# Patient Record
Sex: Male | Born: 1935 | Race: White | Hispanic: No | Marital: Married | Smoking: Former smoker
Health system: Southern US, Community
[De-identification: ages and names within clinical notes are randomized; demographics above are authoritative.]

## PROBLEM LIST (undated history)

## (undated) DIAGNOSIS — I739 Peripheral vascular disease, unspecified: Secondary | ICD-10-CM

## (undated) DIAGNOSIS — I10 Essential (primary) hypertension: Secondary | ICD-10-CM

## (undated) DIAGNOSIS — I2510 Atherosclerotic heart disease of native coronary artery without angina pectoris: Secondary | ICD-10-CM

## (undated) DIAGNOSIS — E119 Type 2 diabetes mellitus without complications: Secondary | ICD-10-CM

## (undated) DIAGNOSIS — C801 Malignant (primary) neoplasm, unspecified: Secondary | ICD-10-CM

## (undated) DIAGNOSIS — N189 Chronic kidney disease, unspecified: Secondary | ICD-10-CM

## (undated) HISTORY — PX: NEPHRECTOMY: SHX65

## (undated) HISTORY — PX: HERNIA REPAIR: SHX51

## (undated) HISTORY — PX: EYE SURGERY: SHX253

## (undated) HISTORY — PX: CORONARY ARTERY BYPASS GRAFT: SHX141

---

## 2004-06-13 ENCOUNTER — Ambulatory Visit: Payer: Self-pay | Admitting: Radiation Oncology

## 2004-06-22 ENCOUNTER — Ambulatory Visit: Payer: Self-pay | Admitting: Radiation Oncology

## 2005-06-12 ENCOUNTER — Ambulatory Visit: Payer: Self-pay | Admitting: Radiation Oncology

## 2005-06-22 ENCOUNTER — Ambulatory Visit: Payer: Self-pay | Admitting: Radiation Oncology

## 2006-06-14 ENCOUNTER — Ambulatory Visit: Payer: Self-pay | Admitting: Radiation Oncology

## 2006-06-22 ENCOUNTER — Ambulatory Visit: Payer: Self-pay | Admitting: Radiation Oncology

## 2007-01-08 ENCOUNTER — Ambulatory Visit: Payer: Self-pay | Admitting: Urology

## 2007-02-08 ENCOUNTER — Ambulatory Visit: Payer: Self-pay

## 2007-02-09 ENCOUNTER — Inpatient Hospital Stay: Payer: Self-pay | Admitting: Surgery

## 2007-02-09 ENCOUNTER — Other Ambulatory Visit: Payer: Self-pay

## 2007-05-23 ENCOUNTER — Ambulatory Visit: Payer: Self-pay | Admitting: Radiation Oncology

## 2007-06-13 ENCOUNTER — Ambulatory Visit: Payer: Self-pay | Admitting: Radiation Oncology

## 2007-06-23 ENCOUNTER — Ambulatory Visit: Payer: Self-pay | Admitting: Radiation Oncology

## 2008-05-22 ENCOUNTER — Ambulatory Visit: Payer: Self-pay | Admitting: Radiation Oncology

## 2008-06-15 ENCOUNTER — Ambulatory Visit: Payer: Self-pay | Admitting: Radiation Oncology

## 2008-06-22 ENCOUNTER — Ambulatory Visit: Payer: Self-pay | Admitting: Radiation Oncology

## 2009-05-22 ENCOUNTER — Ambulatory Visit: Payer: Self-pay | Admitting: Radiation Oncology

## 2009-06-16 ENCOUNTER — Ambulatory Visit: Payer: Self-pay | Admitting: Radiation Oncology

## 2009-06-22 ENCOUNTER — Ambulatory Visit: Payer: Self-pay | Admitting: Radiation Oncology

## 2009-07-28 ENCOUNTER — Ambulatory Visit: Payer: Self-pay | Admitting: Internal Medicine

## 2010-06-01 ENCOUNTER — Emergency Department: Payer: Self-pay | Admitting: Emergency Medicine

## 2010-06-16 ENCOUNTER — Ambulatory Visit: Payer: Self-pay | Admitting: Radiation Oncology

## 2010-06-22 ENCOUNTER — Ambulatory Visit: Payer: Self-pay | Admitting: Radiation Oncology

## 2012-01-31 ENCOUNTER — Ambulatory Visit: Payer: Self-pay | Admitting: Urology

## 2012-01-31 DIAGNOSIS — I2510 Atherosclerotic heart disease of native coronary artery without angina pectoris: Secondary | ICD-10-CM

## 2012-01-31 LAB — CBC WITH DIFFERENTIAL/PLATELET
Basophil %: 0.5 %
Eosinophil #: 0.1 10*3/uL (ref 0.0–0.7)
Eosinophil %: 2.2 %
HGB: 13.8 g/dL (ref 13.0–18.0)
Lymphocyte #: 1.8 10*3/uL (ref 1.0–3.6)
Lymphocyte %: 28.8 %
MCHC: 34.6 g/dL (ref 32.0–36.0)
MCV: 91 fL (ref 80–100)
Monocyte %: 14.8 %
Neutrophil #: 3.4 10*3/uL (ref 1.4–6.5)
Neutrophil %: 53.7 %
RBC: 4.37 10*6/uL — ABNORMAL LOW (ref 4.40–5.90)
WBC: 6.4 10*3/uL (ref 3.8–10.6)

## 2012-01-31 LAB — BASIC METABOLIC PANEL
BUN: 22 mg/dL — ABNORMAL HIGH (ref 7–18)
Chloride: 109 mmol/L — ABNORMAL HIGH (ref 98–107)
Co2: 26 mmol/L (ref 21–32)
Creatinine: 1.51 mg/dL — ABNORMAL HIGH (ref 0.60–1.30)
Sodium: 143 mmol/L (ref 136–145)

## 2012-02-05 ENCOUNTER — Ambulatory Visit: Payer: Self-pay | Admitting: Urology

## 2012-12-25 ENCOUNTER — Ambulatory Visit: Payer: Self-pay | Admitting: Gastroenterology

## 2012-12-27 LAB — PATHOLOGY REPORT

## 2013-01-14 ENCOUNTER — Ambulatory Visit: Payer: Self-pay | Admitting: Urology

## 2013-01-14 DIAGNOSIS — I2510 Atherosclerotic heart disease of native coronary artery without angina pectoris: Secondary | ICD-10-CM

## 2013-01-14 LAB — BASIC METABOLIC PANEL
Anion Gap: 3 — ABNORMAL LOW (ref 7–16)
Calcium, Total: 9 mg/dL (ref 8.5–10.1)
Chloride: 106 mmol/L (ref 98–107)
Creatinine: 1.82 mg/dL — ABNORMAL HIGH (ref 0.60–1.30)
EGFR (Non-African Amer.): 34 — ABNORMAL LOW
Osmolality: 280 (ref 275–301)

## 2013-01-14 LAB — CBC WITH DIFFERENTIAL/PLATELET
Basophil #: 0 10*3/uL (ref 0.0–0.1)
Basophil %: 0.4 %
Eosinophil %: 2.9 %
HCT: 36.4 % — ABNORMAL LOW (ref 40.0–52.0)
HGB: 12.8 g/dL — ABNORMAL LOW (ref 13.0–18.0)
Lymphocyte #: 2 10*3/uL (ref 1.0–3.6)
Lymphocyte %: 34.1 %
MCH: 31.5 pg (ref 26.0–34.0)
MCHC: 35.1 g/dL (ref 32.0–36.0)
MCV: 90 fL (ref 80–100)
Neutrophil %: 50 %
Platelet: 117 10*3/uL — ABNORMAL LOW (ref 150–440)
WBC: 5.9 10*3/uL (ref 3.8–10.6)

## 2013-01-17 LAB — URINE CULTURE

## 2013-01-22 ENCOUNTER — Ambulatory Visit: Payer: Self-pay | Admitting: Urology

## 2014-02-14 ENCOUNTER — Ambulatory Visit: Payer: Self-pay | Admitting: Unknown Physician Specialty

## 2014-04-14 ENCOUNTER — Ambulatory Visit: Payer: Self-pay | Admitting: Internal Medicine

## 2014-09-08 NOTE — Op Note (Signed)
PATIENT NAME:  Gregory Luna, Gregory Luna MR#:  [id number] DATE OF BIRTH:  Feb 04, 1929  DATE OF PROCEDURE:  02/05/2012  PREOPERATIVE DIAGNOSIS: Transitional cell carcinoma of the bladder.   POSTOPERATIVE DIAGNOSIS: Transitional cell carcinoma of the bladder, urethral stricture (severe), possible carcinoma in situ.  SURGEON: Alcus Dad, MD  ANESTHESIA: General.   INDICATIONS: This 81 year old gentleman has had recurrent transitional cell carcinoma of the bladder for several years. On his most recent cystoscopy, he was found to have multiple areas of inflammation of the bladder wall reminiscent of carcinoma in situ. There were a few fronds. He has a severe urethral stricture in the posterior urethra. He is scheduled for dilatation of the urethral stricture, biopsy of the bladder in suspicious areas and instillation of mitomycin.   DESCRIPTION OF PROCEDURE: In the dorsal lithotomy position under general anesthesia, the genital area was prepped and draped for endoscopic work. The #22 cystoscope was introduced to the posterior urethra where a dense stricture was localized. Gregory Luna sounds were used to progressively dilate the strictured area to 24 [place].   The #22 French cystoscope was introduced and using biopsy forceps a number of areas were biopsied on the posterior bladder wall. The ball electrode was introduced and the bleeding spots brought under control. Following that an 18-French Foley catheter was introduced and 40 mg of mitomycin instilled into the bladder. The patient tolerated the procedures well and returned to the recovery area in satisfactory condition. ____________________________ Alcus Dad, MD jsh:slb D: 02/05/2012 15:48:43 ET     T: 02/05/2012 17:51:50 ET        JOB#: [id number] cc: Alcus Dad, MD, <Dictator> Alcus Dad MD ELECTRONICALLY SIGNED 02/06/2012 12:33

## 2014-09-11 NOTE — Op Note (Signed)
PATIENT NAME:  Gregory Luna, Gregory Luna MR#:  [id number] DATE OF BIRTH:  [birth year]  DATE OF PROCEDURE:  01/22/2013  PREOPERATIVE DIAGNOSES:  1.  Neoplasm bladder, unspecified.  2.  History of transitional cell carcinoma of the bladder.   POSTOPERATIVE DIAGNOSES:  1.  Neoplasm bladder, unspecified.  2.  History of transitional cell carcinoma of the bladder.   PROCEDURES:  1.  Transurethral resection of bladder tumor.  2.  Right retrograde pyelogram.   SURGEON: John Giovanni, M.D.   ASSISTANT: None.   ANESTHESIA: General.   INDICATION: This is an 81 year old male with a long history of recurrent transitional cell carcinoma of the bladder. He was initially diagnosed with TCC of the left renal pelvis and 1995 and underwent a left nephroureterectomy. He has had recurrent superficial transitional cell carcinoma of the bladder, multiple bladder biopsies and TURBT. Recent cystoscopy did show abnormal appearing mucosa at the bladder base. He presents for cystoscopy under anesthesia with TURBT and right retrograde pyelogram.   DESCRIPTION OF PROCEDURE: The patient was taken to the operating room where a general anesthetic was administered. He was then placed in the low lithotomy position and his external genitalia were prepped and draped in the usual fashion. Timeout was performed per protocol, with all in agreement. A 21-French cystoscope with 30 degree lens was lubricated and passed under direct vision. The urethra was normal in caliber without stricture. The prostate was remarkable for mild lateral lobe enlargement and fairly significant bladder neck elevation. Panendoscopy was performed. At the left and right bladder base, was an area of erythema with fibrinous exudative appearing mucosa. No definite papillary or solid lesions were seen. Left ureteral orifice was surgically absent. The right ureteral orifice was effluxing clear urine. An 8-French cone-tipped catheter was placed through the cystoscope at  the right ureteral orifice. Right retrograde pyelogram was performed. The ureter showed no filling defect or evidence of obstruction. The right renal pelvis showed no filling defect or evidence of obstruction. There was prompt drainage of contrast under real-time fluoroscopy. The cystoscope was removed.   A 27-French continuous flow resectoscope sheath with visual obturator was passed into the bladder without difficulty. The Iglesias resectoscope loop was then placed into the sheath. The left bladder base area was accomplished at approximately 2 cm and was resected down to superficial muscle. The smaller area on the right was approximately 1 cm and resected. Hemostasis was obtained with cautery. All specimen was removed with irrigation. At the conclusion of the procedure, the right ureter was effluxing clear urine. Hemostasis was adequate. The resectoscope was removed. An 18-French Foley catheter was placed with return of light rosea effluent upon irrigation. Catheter was placed to gravity drainage. B and O suppository was placed per rectum. He was taken to the PACU in stable condition. There were no complications. EBL was minimal.   ____________________________ Ronda Fairly. Bernardo Heater, MD scs:aw D: 01/22/2013 14:44:17 ET T: 01/22/2013 15:44:11 ET JOB#: [id number]  cc: Nicki Reaper C. Bernardo Heater, MD, <Dictator> Abbie Sons MD ELECTRONICALLY SIGNED 02/12/2013 10:03

## 2014-11-29 ENCOUNTER — Emergency Department
Admission: EM | Admit: 2014-11-29 | Discharge: 2014-11-29 | Disposition: A | Discharge status: Home / Self Care | Payer: Medicare Other | Attending: Student | Admitting: Student

## 2014-11-29 ENCOUNTER — Emergency Department: Payer: Medicare Other

## 2014-11-29 ENCOUNTER — Encounter: Payer: Self-pay | Admitting: Radiology

## 2014-11-29 DIAGNOSIS — W19XXXA Unspecified fall, initial encounter: Secondary | ICD-10-CM

## 2014-11-29 DIAGNOSIS — S0990XA Unspecified injury of head, initial encounter: Secondary | ICD-10-CM | POA: Diagnosis present

## 2014-11-29 DIAGNOSIS — S01112A Laceration without foreign body of left eyelid and periocular area, initial encounter: Secondary | ICD-10-CM | POA: Insufficient documentation

## 2014-11-29 DIAGNOSIS — Y9289 Other specified places as the place of occurrence of the external cause: Secondary | ICD-10-CM | POA: Diagnosis not present

## 2014-11-29 DIAGNOSIS — Y998 Other external cause status: Secondary | ICD-10-CM | POA: Diagnosis not present

## 2014-11-29 DIAGNOSIS — S63285A Dislocation of proximal interphalangeal joint of left ring finger, initial encounter: Secondary | ICD-10-CM | POA: Insufficient documentation

## 2014-11-29 DIAGNOSIS — W01198A Fall on same level from slipping, tripping and stumbling with subsequent striking against other object, initial encounter: Secondary | ICD-10-CM | POA: Diagnosis not present

## 2014-11-29 DIAGNOSIS — IMO0002 Reserved for concepts with insufficient information to code with codable children: Secondary | ICD-10-CM

## 2014-11-29 DIAGNOSIS — Y9389 Activity, other specified: Secondary | ICD-10-CM | POA: Insufficient documentation

## 2014-11-29 DIAGNOSIS — S63259A Unspecified dislocation of unspecified finger, initial encounter: Secondary | ICD-10-CM

## 2014-11-29 MED ORDER — BACITRACIN ZINC 500 UNIT/GM EX OINT
1.0000 "application " | TOPICAL_OINTMENT | Freq: Two times a day (BID) | CUTANEOUS | Status: DC
  Administered 2014-11-29: 1 via TOPICAL

## 2014-11-29 MED ORDER — BACITRACIN ZINC 500 UNIT/GM EX OINT
TOPICAL_OINTMENT | CUTANEOUS | Status: AC
  Administered 2014-11-29: 1 via TOPICAL
  Filled 2014-11-29: qty 0.9

## 2014-11-29 MED ORDER — LIDOCAINE HCL (PF) 1 % IJ SOLN
5.0000 mL | Freq: Once | INTRAMUSCULAR | Status: AC
  Administered 2014-11-29: 5 mL via INTRADERMAL

## 2014-11-29 MED ORDER — LIDOCAINE HCL (PF) 1 % IJ SOLN
INTRAMUSCULAR | Status: AC
  Filled 2014-11-29: qty 5

## 2014-11-29 MED ORDER — LIDOCAINE HCL (PF) 1 % IJ SOLN
INTRAMUSCULAR | Status: AC
  Administered 2014-11-29: 5 mL via INTRADERMAL
  Filled 2014-11-29: qty 5

## 2014-11-29 NOTE — ED Provider Notes (Signed)
[facility] Emergency Department Provider Note  ____________________________________________  Time seen: Approximately 5:39 PM  I have reviewed the triage vital signs and the nursing notes.   HISTORY  Chief Complaint Fall    HPI [name] is a 81 y.o. 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 The patient walked into the other room, to decided to sit down on his bed and his leg slid out from under him. He fell forward hitting his left eyebrow/forehead on a table in front of him. No loss of consciousness. He immediately cried out for help and his wife found him. He is also complaining of left fourth finger pain and has a notable deformity there. Current pain severity is moderate. He did take Opana and Dilaudid today which she is prescribed. No modifying factors. No vision change. No recent illness including no cough, sneezing, runny nose, congestion, vomiting or diarrhea. No fevers or chills.   History reviewed. No pertinent past medical history.  There are no active problems to display for this patient.   No past surgical history on file.  No current outpatient prescriptions on file.  Allergies Review of patient's allergies indicates no known allergies.  No family history on file.  Social History History  Substance Use Topics  . Smoking status: Not on file  . Smokeless tobacco: Not on file  . Alcohol Use: Not on file    Review of Systems Constitutional: No fever/chills Eyes: No visual changes. ENT: No sore throat. Cardiovascular: Denies chest pain. Respiratory: Denies shortness of breath. Gastrointestinal: No abdominal pain.  No nausea, no vomiting.  No diarrhea.  No constipation. Genitourinary: Negative for dysuria. Musculoskeletal: Negative for back pain. Skin: Negative for rash. Neurological: Negative for headaches, focal  weakness or numbness.  10-point ROS otherwise negative.  ____________________________________________   PHYSICAL EXAM:  VITAL SIGNS: ED Triage Vitals  Enc Vitals Group     BP 11/29/14 1725 140/67 mmHg     Pulse Rate 11/29/14 1725 77     Resp --      Temp 11/29/14 1725 97.8 F (36.6 C)     Temp Source 11/29/14 1725 Oral     SpO2 11/29/14 1725 100 %     Weight 11/29/14 1725 175 lb (79.379 kg)     Height 11/29/14 1725 6\' 1"  (1.854 m)     Head Cir --      Peak Flow --      Pain Score 11/29/14 1727 10     Pain Loc --      Pain Edu? --      Excl. in [place]? --     Constitutional: Alert and oriented self and place. Well appearing and in no acute distress. Eyes: Conjunctivae are normal. Head:  The patient has chronic redness/discoloration/swelling to the left eyebrow, left temporal region as well as a gaping 1 cm laceration with surrounding hematoma. He has mild left periorbital swelling, chronic drainage from the left eye but extraocular movements appear intact and pupils are equally reactive to light. Nose: No congestion/rhinnorhea. Mouth/Throat: Mucous membranes are moist.  Oropharynx non-erythematous. Neck: No stridor. No midline c-spine tenderness to palpation. Cardiovascular: Normal rate, regular rhythm. Grossly normal heart sounds.  Good peripheral circulation. Respiratory: Normal respiratory effort.  No retractions. Lungs CTAB. Gastrointestinal: Soft and nontender. No distention. No abdominal bruits. No CVA tenderness. Genitourinary: deferred Musculoskeletal: No lower extremity tenderness nor edema.  No joint effusions. Obvious dislocation/deformity of the left fourth finger. Neurologic:  Normal speech and language. No gross focal neurologic deficits are appreciated. Speech is normal. No gait instability. Skin:  Skin is warm, dry and intact. No rash noted. Psychiatric: Mood and affect are normal. Speech and behavior are normal.  ____________________________________________    LABS (all labs ordered are listed, but only abnormal results are displayed)  Labs Reviewed - No data to display ____________________________________________  EKG  none ____________________________________________  RADIOLOGY  CT head/c-spine/maxillofacial IMPRESSION: Mild diffuse cortical atrophy. Mild chronic ischemic white matter disease. No acute intracranial abnormality seen.  No acute abnormality seen in maxillofacial region.  Multilevel degenerative disc disease is noted. No acute abnormality seen in the cervical spine.   Xray left hand  IMPRESSION: Dislocation of the 4th digit at the PIP joint.   Xray left 4th finger IMPRESSION: 1. Anatomic alignment has been restored, status post closed reduction and splinting.  ____________________________________________   PROCEDURES  Procedure(s) performed:   Reduction of dislocation Date/Time: 9:10 PM Performed by: Joanne Gavel Authorized by: Loura Pardon A Consent: Verbal consent obtained. Risks and benefits: risks, benefits and alternatives were discussed Consent given by: patient Required items: required blood products, implants, devices, and special equipment available Time out: Immediately prior to procedure a "time out" was called to verify the correct patient, procedure, equipment, support staff and site/side marked as required.  Patient sedated:no  Vitals: Vital signs were monitored during sedation. Patient tolerance: Patient tolerated the procedure well with no immediate complications. Joint: left fouth PIP Reduction technique: gentle traction Digital nerve block performed with 8 m of 1% lidocaine without epi   Critical Care performed: No  ____________________________________________   INITIAL IMPRESSION / ASSESSMENT AND PLAN / ED COURSE  Pertinent labs & imaging results that were available during my care of the patient were reviewed by me and considered in my medical decision making (see  chart for details).  Gregory Luna is a 81 y.o. 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 On exam, he does not appear to be in any acute distress. Vital signs stable, he is afebrile. He does have obvious gaping laceration associated with the left lateral eyebrow/temporal region which will require repair. Tetanus is up-to-date. He has chronic discharge associated with the left eye, according to his family.Marland Kitchen Extraocular movements appear intact at this time. His only other injury appears to be his associated with the left fourth finger  we'll obtain CT head, maxilloacial CT, CT C-spine as well as plain films of the left hand.  ----------------------------------------- 8:00 PM on 11/29/2014 -----------------------------------------  Successful reduction of left PIP dislocation, splint applied by me, neurovascularly intact after the procedure which was tolerated well. I discussed laceration repair of this wound associated with the left eyebrow. The patient's daughter has photographed it, sent the picture to the patient's skin cancer doctor who recommends against any sutures given history of poor vascular supply overlying his known skin cancer (he recommends healing by secondary intention) and he will follow-up with the patient in clinic in 2 days. The family and the patient do not want the laceration sutured so will irrigate and dress it. Return precautions discussed. We'll DC home. ____________________________________________   FINAL CLINICAL IMPRESSION(S) / ED DIAGNOSES  Final diagnoses:  Fall, initial encounter  Head injury without concussion or intracranial hemorrhage, initial encounter  Laceration  Dislocation, finger closed, initial encounter  Joanne Gavel, MD 11/29/14 2110

## 2014-11-29 NOTE — ED Notes (Signed)
Fell - no loc, no dizziness. Has left ring finger dislocation and left brow injury with eye hematoma

## 2014-11-29 NOTE — ED Notes (Signed)
OK per MD for family to give patient his home pain medications.

## 2015-03-26 ENCOUNTER — Inpatient Hospital Stay
Admission: EM | Admit: 2015-03-26 | Discharge: 2015-03-28 | DRG: 872 | Disposition: A | Discharge status: Home Health | Payer: Medicare Other | Attending: Internal Medicine | Admitting: Internal Medicine

## 2015-03-26 ENCOUNTER — Encounter: Payer: Self-pay | Admitting: Emergency Medicine

## 2015-03-26 ENCOUNTER — Emergency Department: Payer: Medicare Other

## 2015-03-26 DIAGNOSIS — Z905 Acquired absence of kidney: Secondary | ICD-10-CM | POA: Diagnosis not present

## 2015-03-26 DIAGNOSIS — G893 Neoplasm related pain (acute) (chronic): Secondary | ICD-10-CM | POA: Diagnosis present

## 2015-03-26 DIAGNOSIS — N390 Urinary tract infection, site not specified: Secondary | ICD-10-CM | POA: Diagnosis not present

## 2015-03-26 DIAGNOSIS — Z951 Presence of aortocoronary bypass graft: Secondary | ICD-10-CM | POA: Diagnosis not present

## 2015-03-26 DIAGNOSIS — A419 Sepsis, unspecified organism: Secondary | ICD-10-CM | POA: Diagnosis present

## 2015-03-26 DIAGNOSIS — Z8546 Personal history of malignant neoplasm of prostate: Secondary | ICD-10-CM | POA: Diagnosis not present

## 2015-03-26 DIAGNOSIS — I129 Hypertensive chronic kidney disease with stage 1 through stage 4 chronic kidney disease, or unspecified chronic kidney disease: Secondary | ICD-10-CM | POA: Diagnosis present

## 2015-03-26 DIAGNOSIS — C679 Malignant neoplasm of bladder, unspecified: Secondary | ICD-10-CM | POA: Diagnosis not present

## 2015-03-26 DIAGNOSIS — R410 Disorientation, unspecified: Secondary | ICD-10-CM

## 2015-03-26 DIAGNOSIS — R509 Fever, unspecified: Secondary | ICD-10-CM

## 2015-03-26 DIAGNOSIS — N183 Chronic kidney disease, stage 3 (moderate): Secondary | ICD-10-CM | POA: Diagnosis not present

## 2015-03-26 DIAGNOSIS — N179 Acute kidney failure, unspecified: Secondary | ICD-10-CM | POA: Diagnosis present

## 2015-03-26 DIAGNOSIS — E860 Dehydration: Secondary | ICD-10-CM | POA: Diagnosis not present

## 2015-03-26 DIAGNOSIS — I959 Hypotension, unspecified: Secondary | ICD-10-CM | POA: Diagnosis present

## 2015-03-26 DIAGNOSIS — I739 Peripheral vascular disease, unspecified: Secondary | ICD-10-CM | POA: Diagnosis present

## 2015-03-26 DIAGNOSIS — I2510 Atherosclerotic heart disease of native coronary artery without angina pectoris: Secondary | ICD-10-CM | POA: Diagnosis present

## 2015-03-26 DIAGNOSIS — D638 Anemia in other chronic diseases classified elsewhere: Secondary | ICD-10-CM | POA: Diagnosis not present

## 2015-03-26 DIAGNOSIS — N289 Disorder of kidney and ureter, unspecified: Secondary | ICD-10-CM

## 2015-03-26 DIAGNOSIS — C44309 Unspecified malignant neoplasm of skin of other parts of face: Secondary | ICD-10-CM | POA: Diagnosis present

## 2015-03-26 DIAGNOSIS — Z87891 Personal history of nicotine dependence: Secondary | ICD-10-CM

## 2015-03-26 DIAGNOSIS — Z79899 Other long term (current) drug therapy: Secondary | ICD-10-CM | POA: Diagnosis not present

## 2015-03-26 DIAGNOSIS — D899 Disorder involving the immune mechanism, unspecified: Secondary | ICD-10-CM | POA: Diagnosis not present

## 2015-03-26 DIAGNOSIS — E440 Moderate protein-calorie malnutrition: Secondary | ICD-10-CM | POA: Insufficient documentation

## 2015-03-26 DIAGNOSIS — Z23 Encounter for immunization: Secondary | ICD-10-CM | POA: Diagnosis not present

## 2015-03-26 DIAGNOSIS — N1330 Unspecified hydronephrosis: Secondary | ICD-10-CM | POA: Diagnosis present

## 2015-03-26 DIAGNOSIS — E119 Type 2 diabetes mellitus without complications: Secondary | ICD-10-CM | POA: Diagnosis not present

## 2015-03-26 HISTORY — DX: Chronic kidney disease, unspecified: N18.9

## 2015-03-26 HISTORY — DX: Atherosclerotic heart disease of native coronary artery without angina pectoris: I25.10

## 2015-03-26 HISTORY — DX: Essential (primary) hypertension: I10

## 2015-03-26 HISTORY — DX: Malignant (primary) neoplasm, unspecified: C80.1

## 2015-03-26 HISTORY — DX: Type 2 diabetes mellitus without complications: E11.9

## 2015-03-26 HISTORY — DX: Peripheral vascular disease, unspecified: I73.9

## 2015-03-26 LAB — COMPREHENSIVE METABOLIC PANEL
ALBUMIN: 2.6 g/dL — AB (ref 3.5–5.0)
ALK PHOS: 99 U/L (ref 38–126)
ALT: 23 U/L (ref 17–63)
AST: 20 U/L (ref 15–41)
Anion gap: 10 (ref 5–15)
BUN: 57 mg/dL — AB (ref 6–20)
CALCIUM: 9.9 mg/dL (ref 8.9–10.3)
CHLORIDE: 107 mmol/L (ref 101–111)
CO2: 22 mmol/L (ref 22–32)
CREATININE: 2.35 mg/dL — AB (ref 0.61–1.24)
GFR calc Af Amer: 28 mL/min — ABNORMAL LOW (ref >60)
GFR calc non Af Amer: 24 mL/min — ABNORMAL LOW (ref >60)
GLUCOSE: 150 mg/dL — AB (ref 65–99)
Potassium: 4.2 mmol/L (ref 3.5–5.1)
SODIUM: 139 mmol/L (ref 135–145)
Total Bilirubin: 0.1 mg/dL — ABNORMAL LOW (ref 0.3–1.2)
Total Protein: 7.6 g/dL (ref 6.5–8.1)

## 2015-03-26 LAB — TROPONIN I: TROPONIN I: 0.03 ng/mL (ref <0.031)

## 2015-03-26 LAB — LACTIC ACID, PLASMA
LACTIC ACID, VENOUS: 1.8 mmol/L (ref 0.5–2.0)
Lactic Acid, Venous: 1.5 mmol/L (ref 0.5–2.0)

## 2015-03-26 LAB — URINALYSIS COMPLETE WITH MICROSCOPIC (ARMC ONLY)
Bilirubin Urine: NEGATIVE
GLUCOSE, UA: NEGATIVE mg/dL
Ketones, ur: NEGATIVE mg/dL
Nitrite: NEGATIVE
PH: 6 (ref 5.0–8.0)
Protein, ur: 30 mg/dL — AB
SQUAMOUS EPITHELIAL / LPF: NONE SEEN
Specific Gravity, Urine: 1.011 (ref 1.005–1.030)

## 2015-03-26 LAB — IRON AND TIBC
Iron: 17 ug/dL — ABNORMAL LOW (ref 45–182)
SATURATION RATIOS: 9 % — AB (ref 17.9–39.5)
TIBC: 181 ug/dL — ABNORMAL LOW (ref 250–450)
UIBC: 164 ug/dL

## 2015-03-26 LAB — CBC WITH DIFFERENTIAL/PLATELET
BASOS PCT: 0 %
Basophils Absolute: 0 10*3/uL (ref 0–0.1)
EOS ABS: 0.1 10*3/uL (ref 0–0.7)
Eosinophils Relative: 1 %
HCT: 30.6 % — ABNORMAL LOW (ref 40.0–52.0)
Hemoglobin: 9.9 g/dL — ABNORMAL LOW (ref 13.0–18.0)
LYMPHS ABS: 1 10*3/uL (ref 1.0–3.6)
Lymphocytes Relative: 8 %
MCH: 28.6 pg (ref 26.0–34.0)
MCHC: 32.4 g/dL (ref 32.0–36.0)
MCV: 88.1 fL (ref 80.0–100.0)
MONO ABS: 1.3 10*3/uL — AB (ref 0.2–1.0)
MONOS PCT: 10 %
Neutro Abs: 11.1 10*3/uL — ABNORMAL HIGH (ref 1.4–6.5)
Neutrophils Relative %: 81 %
Platelets: 272 10*3/uL (ref 150–440)
RBC: 3.48 MIL/uL — ABNORMAL LOW (ref 4.40–5.90)
RDW: 15.6 % — AB (ref 11.5–14.5)
WBC: 13.6 10*3/uL — ABNORMAL HIGH (ref 3.8–10.6)

## 2015-03-26 LAB — PROTIME-INR
INR: 1.13
PROTHROMBIN TIME: 14.7 s (ref 11.4–15.0)

## 2015-03-26 LAB — FOLATE: FOLATE: 11.3 ng/mL (ref >5.9)

## 2015-03-26 LAB — RETICULOCYTES
RBC.: 3.47 MIL/uL — AB (ref 4.40–5.90)
RETIC COUNT ABSOLUTE: 72.9 10*3/uL (ref 19.0–183.0)
RETIC CT PCT: 2.1 % (ref 0.4–3.1)

## 2015-03-26 LAB — APTT: APTT: 30 s (ref 24–36)

## 2015-03-26 LAB — FERRITIN: Ferritin: 1048 ng/mL — ABNORMAL HIGH (ref 24–336)

## 2015-03-26 MED ORDER — DEXTROMETHORPHAN-GUAIFENESIN 10-100 MG/5ML PO LIQD
5.0000 mL | ORAL | Status: DC | PRN
  Filled 2015-03-26: qty 5

## 2015-03-26 MED ORDER — POLYETHYL GLYCOL-PROPYL GLYCOL 0.4-0.3 % OP GEL: OPHTHALMIC | Status: DC | PRN

## 2015-03-26 MED ORDER — PIPERACILLIN-TAZOBACTAM 3.375 G IVPB 30 MIN
3.3750 g | Freq: Once | INTRAVENOUS | Status: AC
  Administered 2015-03-26: 3.375 g via INTRAVENOUS
  Filled 2015-03-26: qty 50

## 2015-03-26 MED ORDER — CHLORHEXIDINE GLUCONATE 0.12 % MT SOLN
15.0000 mL | Freq: Two times a day (BID) | OROMUCOSAL | Status: DC
  Administered 2015-03-26 – 2015-03-28 (×4): 15 mL via OROMUCOSAL
  Filled 2015-03-26 (×4): qty 15

## 2015-03-26 MED ORDER — ONDANSETRON HCL 4 MG/2ML IJ SOLN: 4.0000 mg | Freq: Four times a day (QID) | INTRAMUSCULAR | Status: DC | PRN

## 2015-03-26 MED ORDER — SODIUM CHLORIDE 0.9 % IV BOLUS (SEPSIS)
1000.0000 mL | INTRAVENOUS | Status: AC
  Administered 2015-03-26: 1000 mL via INTRAVENOUS

## 2015-03-26 MED ORDER — SODIUM CHLORIDE 0.9 % IV SOLN
INTRAVENOUS | Status: AC
  Administered 2015-03-26 – 2015-03-27 (×3): via INTRAVENOUS

## 2015-03-26 MED ORDER — LUBRIFRESH P.M. OP OINT
TOPICAL_OINTMENT | Freq: Every day | OPHTHALMIC | Status: DC
  Administered 2015-03-26: 1 via OPHTHALMIC
  Administered 2015-03-27: 22:00:00 via OPHTHALMIC
  Filled 2015-03-26: qty 3.5

## 2015-03-26 MED ORDER — PANTOPRAZOLE SODIUM 40 MG PO TBEC
40.0000 mg | DELAYED_RELEASE_TABLET | Freq: Every day | ORAL | Status: DC
  Administered 2015-03-26 – 2015-03-28 (×3): 40 mg via ORAL
  Filled 2015-03-26 (×4): qty 1

## 2015-03-26 MED ORDER — ONDANSETRON HCL 4 MG PO TABS: 4.0000 mg | ORAL_TABLET | Freq: Four times a day (QID) | ORAL | Status: DC | PRN

## 2015-03-26 MED ORDER — ACETAMINOPHEN 650 MG RE SUPP: 650.0000 mg | Freq: Four times a day (QID) | RECTAL | Status: DC | PRN

## 2015-03-26 MED ORDER — HYPROMELLOSE 0.3 % OP GEL
1.0000 "application " | Freq: Every day | OPHTHALMIC | Status: DC
  Filled 2015-03-26: qty 3.5

## 2015-03-26 MED ORDER — SODIUM CHLORIDE 0.9 % IJ SOLN
3.0000 mL | Freq: Two times a day (BID) | INTRAMUSCULAR | Status: DC
  Administered 2015-03-26 – 2015-03-27 (×3): 3 mL via INTRAVENOUS

## 2015-03-26 MED ORDER — HEPARIN SODIUM (PORCINE) 5000 UNIT/ML IJ SOLN
5000.0000 [IU] | Freq: Three times a day (TID) | INTRAMUSCULAR | Status: DC
  Administered 2015-03-26 – 2015-03-28 (×5): 5000 [IU] via SUBCUTANEOUS
  Filled 2015-03-26 (×7): qty 1

## 2015-03-26 MED ORDER — SODIUM CHLORIDE 0.9 % IV BOLUS (SEPSIS): 500.0000 mL | INTRAVENOUS | Status: DC

## 2015-03-26 MED ORDER — HYPROMELLOSE 0.3 % OP GEL: 1.0000 "application " | Freq: Every day | OPHTHALMIC | Status: DC

## 2015-03-26 MED ORDER — INFLUENZA VAC SPLIT QUAD 0.5 ML IM SUSY
0.5000 mL | PREFILLED_SYRINGE | INTRAMUSCULAR | Status: AC
  Administered 2015-03-27: 0.5 mL via INTRAMUSCULAR
  Filled 2015-03-26: qty 0.5

## 2015-03-26 MED ORDER — NYSTATIN 100000 UNIT/ML MT SUSP
5.0000 mL | Freq: Two times a day (BID) | OROMUCOSAL | Status: DC
  Administered 2015-03-26 – 2015-03-28 (×4): 500000 [IU] via ORAL
  Filled 2015-03-26 (×4): qty 5

## 2015-03-26 MED ORDER — FINASTERIDE 5 MG PO TABS
5.0000 mg | ORAL_TABLET | Freq: Every day | ORAL | Status: DC
  Administered 2015-03-26 – 2015-03-28 (×3): 5 mg via ORAL
  Filled 2015-03-26 (×3): qty 1

## 2015-03-26 MED ORDER — HYDROMORPHONE HCL 2 MG PO TABS
4.0000 mg | ORAL_TABLET | ORAL | Status: DC | PRN
  Administered 2015-03-26 – 2015-03-28 (×3): 4 mg via ORAL
  Filled 2015-03-26 (×3): qty 2

## 2015-03-26 MED ORDER — MORPHINE SULFATE ER 30 MG PO TBCR
30.0000 mg | EXTENDED_RELEASE_TABLET | Freq: Two times a day (BID) | ORAL | Status: DC
  Administered 2015-03-26 – 2015-03-28 (×4): 30 mg via ORAL
  Filled 2015-03-26 (×4): qty 1

## 2015-03-26 MED ORDER — ACETAMINOPHEN 325 MG PO TABS: 650.0000 mg | ORAL_TABLET | Freq: Four times a day (QID) | ORAL | Status: DC | PRN

## 2015-03-26 MED ORDER — PIPERACILLIN-TAZOBACTAM 3.375 G IVPB
3.3750 g | Freq: Three times a day (TID) | INTRAVENOUS | Status: DC
  Administered 2015-03-27 – 2015-03-28 (×5): 3.375 g via INTRAVENOUS
  Filled 2015-03-26 (×9): qty 50

## 2015-03-26 MED ORDER — DOCUSATE SODIUM 100 MG PO CAPS
100.0000 mg | ORAL_CAPSULE | Freq: Two times a day (BID) | ORAL | Status: DC
  Administered 2015-03-26 – 2015-03-28 (×4): 100 mg via ORAL
  Filled 2015-03-26 (×4): qty 1

## 2015-03-26 MED ORDER — VANCOMYCIN HCL IN DEXTROSE 1-5 GM/200ML-% IV SOLN
1000.0000 mg | Freq: Once | INTRAVENOUS | Status: AC
  Administered 2015-03-26: 1000 mg via INTRAVENOUS
  Filled 2015-03-26: qty 200

## 2015-03-26 MED ORDER — VANCOMYCIN HCL 10 G IV SOLR
1250.0000 mg | INTRAVENOUS | Status: DC
  Administered 2015-03-26: 1250 mg via INTRAVENOUS
  Filled 2015-03-26 (×2): qty 1250

## 2015-03-26 NOTE — ED Provider Notes (Signed)
[facility] Emergency Department Provider Note  ____________________________________________  Time seen: Approximately 3:11 PM  I have reviewed the triage vital signs and the nursing notes.   HISTORY  Chief Complaint Weakness and Neck Pain  Waxing/waning mental status (delirium)  HPI Gregory Luna is a 81 y.o. male with a history of skin cancer to the right side of his face with 2 open wounds which are followed by ENT at [facility].  He presents with altered mental status and generalized weaknesssince this morning.  He is also feeling febrile.  Symptoms come and go and at the time of my evaluation his family feels his mental status is close to normal, but he still feels weak and "rough" when asked how he is feeling.  He denies any specific pain including neck pain.  He does complain of some mild lower abdominal discomfort.  He denies nausea/vomiting, shortness of breath, chest pain.  He has not had any recent upper respiratory symptoms.  His primary care provider is Dr. Emily Filbert.   Past Medical History  Diagnosis Date  . Coronary artery disease   . Hypertension   . Peripheral vascular disease ([place])   . Chronic kidney disease   . Diabetes mellitus without complication ([place])   . Cancer [facility])     left face squamous cell, prostate, bladder    Patient Active Problem List   Diagnosis Date Noted  . Sepsis ([place]) 03/26/2015    Past Surgical History  Procedure Laterality Date  . Coronary artery bypass graft    . Eye surgery    . Hernia repair    . Nephrectomy Left     No current outpatient prescriptions on file.  Allergies Review of patient's allergies indicates no known allergies.  Family History  Problem Relation Age of Onset  . Breast cancer Sister     Social History Social History  Substance Use Topics  . Smoking status: Former Smoker    Types: Cigarettes  . Smokeless tobacco: Never Used  . Alcohol Use: No    Review of  Systems Constitutional: Fever Eyes: No visual changes.  Chronic wounds on the left side of his face seem purulent material ENT: No sore throat. Cardiovascular: Denies chest pain. Respiratory: Denies shortness of breath. Gastrointestinal: Mild lower abdominal discomfort.  No nausea, no vomiting.  No diarrhea.  No constipation. Genitourinary: Negative for dysuria. Musculoskeletal: Negative for back pain. Skin: Negative for rash. Neurological: Negative for headaches, focal weakness or numbness.  10-point ROS otherwise negative.  ____________________________________________   PHYSICAL EXAM:  VITAL SIGNS: ED Triage Vitals  Enc Vitals Group     BP 03/26/15 1314 108/58 mmHg     Pulse Rate 03/26/15 1314 106     Resp 03/26/15 1314 20     Temp 03/26/15 1314 101 F (38.3 C)     Temp Source 03/26/15 1314 Oral     SpO2 03/26/15 1314 96 %     Weight 03/26/15 1314 185 lb (83.915 kg)     Height 03/26/15 1314 5\' 11"  (1.803 m)     Head Cir --      Peak Flow --      Pain Score 03/26/15 1317 0     Pain Loc --      Pain Edu? --      Excl. in [place]? --     Constitutional: Somnolent, appears ill but nontoxic. Eyes: Left eye has a wound directly below the eye which is leaking purulent material.  His wife  states that this is a typical appearance.  His right eye is normal appearing.  Pupils are equal and reactive. Head: There is a chronic, packed wound on the left side of his face that is also leaking purulent material which his wife also says is common. Nose: No congestion/rhinnorhea. Mouth/Throat: Mucous membranes are dry.  Oropharynx non-erythematous. Neck: No stridor.  No meningismus Cardiovascular: Tachycardia, regular rhythm. Grossly normal heart sounds.  Good peripheral circulation. Respiratory: Normal respiratory effort.  No retractions. Lungs CTAB. Gastrointestinal: Soft with mild tenderness to palpation of the suprapubic region. No distention. No abdominal bruits. No CVA  tenderness. Musculoskeletal: No lower extremity tenderness nor edema.  No joint effusions. Neurologic:  Baseline speech and language. No gross focal neurologic deficits are appreciated.  Skin:  Skin is warm, dry and intact. No rash noted.   ____________________________________________   LABS (all labs ordered are listed, but only abnormal results are displayed)  Labs Reviewed  COMPREHENSIVE METABOLIC PANEL - Abnormal; Notable for the following:    Glucose, Bld 150 (*)    BUN 57 (*)    Creatinine, Ser 2.35 (*)    Albumin 2.6 (*)    Total Bilirubin 0.1 (*)    GFR calc non Af Amer 24 (*)    GFR calc Af Amer 28 (*)    All other components within normal limits  CBC WITH DIFFERENTIAL/PLATELET - Abnormal; Notable for the following:    WBC 13.6 (*)    RBC 3.48 (*)    Hemoglobin 9.9 (*)    HCT 30.6 (*)    RDW 15.6 (*)    Neutro Abs 11.1 (*)    Monocytes Absolute 1.3 (*)    All other components within normal limits  URINALYSIS COMPLETEWITH MICROSCOPIC ([facility] ONLY) - Abnormal; Notable for the following:    Color, Urine YELLOW (*)    APPearance CLOUDY (*)    Hgb urine dipstick 1+ (*)    Protein, ur 30 (*)    Leukocytes, UA 3+ (*)    Bacteria, UA RARE (*)    All other components within normal limits  IRON AND TIBC - Abnormal; Notable for the following:    Iron 17 (*)    TIBC 181 (*)    Saturation Ratios 9 (*)    All other components within normal limits  FERRITIN - Abnormal; Notable for the following:    Ferritin 1048 (*)    All other components within normal limits  RETICULOCYTES - Abnormal; Notable for the following:    RBC. 3.47 (*)    All other components within normal limits  CULTURE, BLOOD (ROUTINE X 2)  CULTURE, BLOOD (ROUTINE X 2)  URINE CULTURE  LACTIC ACID, PLASMA  LACTIC ACID, PLASMA  TROPONIN I  PROTIME-INR  APTT  FOLATE  VITAMIN B12  OCCULT BLOOD X 1 CARD TO LAB, STOOL  CBC  BASIC METABOLIC PANEL    ____________________________________________  EKG  ED ECG REPORT I, Amorita Vanrossum, the attending physician, personally viewed and interpreted this ECG.   Date: 03/26/2015  EKG Time: 13:33  Rate: 104  Rhythm: sinus tachycardia  Axis: Normal  Intervals: Normal  ST&T Change: Non-specific ST segment / T-wave changes, but no evidence of acute ischemia.   ____________________________________________  RADIOLOGY   Dg Chest 2 View  03/26/2015  CLINICAL DATA:  Cough. EXAM: CHEST  2 VIEW COMPARISON:  February 11, 2007. FINDINGS: The heart size and mediastinal contours are within normal limits. Both lungs are clear. No pneumothorax or pleural effusion is noted.  Sternotomy wires are noted. The visualized skeletal structures are unremarkable. IMPRESSION: No active cardiopulmonary disease. Electronically Signed   By: Marijo Conception, M.D.   On: 03/26/2015 14:32    ____________________________________________   PROCEDURES  Procedure(s) performed: None  Critical Care performed: Yes, see critical care note(s)   CRITICAL CARE Performed by: Hinda Kehr   Total critical care time: 30 minutes  Critical care time was exclusive of separately billable procedures and treating other patients.  Critical care was necessary to treat or prevent imminent or life-threatening deterioration.  Critical care was time spent personally by me on the following activities: development of treatment plan with patient and/or surrogate as well as nursing, discussions with consultants, evaluation of patient's response to treatment, examination of patient, obtaining history from patient or surrogate, ordering and performing treatments and interventions, ordering and review of laboratory studies, ordering and review of radiographic studies, pulse oximetry and re-evaluation of patient's condition.  ____________________________________________   INITIAL IMPRESSION / ASSESSMENT AND PLAN / ED COURSE  Pertinent  labs & imaging results that were available during my care of the patient were reviewed by me and considered in my medical decision making (see chart for details).  Meets sepsis criteria with fever, leukocytosis, tachycardia.  Source not yet identified, suspect wounds, bacteremia, and/or UTI.  Will treat with fluids, empiric abx.  UA obtained by I/O cath.  No hx of heart failure, and patient appears quite dry with ARF as well - will give 63mL/kg.   ----------------------------------------- 3:49 PM on 03/26/2015 -----------------------------------------  Too numerous to count white blood cells in the urine suggests UTI as the source of his infection/sepsis.  Given that he has delirium which also increases his morbidity and mortality, as well as all his chronic medical issues, I will admit for further management and to continue IV hydration and make sure his renal function is improving.  I am paging the hospitalist to discuss at this time ____________________________________________  FINAL CLINICAL IMPRESSION(S) / ED DIAGNOSES  Final diagnoses:  Sepsis, due to unspecified organism ([place])  Fever, unspecified fever cause  Acute renal insufficiency  Delirium  Complicated UTI (urinary tract infection)      NEW MEDICATIONS STARTED DURING THIS VISIT:  Current Discharge Medication List       Hinda Kehr, MD 03/26/15 2044

## 2015-03-26 NOTE — ED Notes (Signed)
Pt arrived from home via EMS with weakness. Per family he has not acted the same this morning. Pt alert and in no distress.

## 2015-03-26 NOTE — H&P (Signed)
[facility] NAME: Gregory Luna    MR#:  [id number]  DATE OF BIRTH:  18-Sep-1928  DATE OF ADMISSION:  03/26/2015  PRIMARY CARE PHYSICIAN: Rusty Aus., MD   REQUESTING/REFERRING PHYSICIAN: Dr. Karma Greaser  CHIEF COMPLAINT:   Chief Complaint  Patient presents with  . Weakness  . Neck Pain    HISTORY OF PRESENT ILLNESS:  Gregory Luna  is a 81 y.o. 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 The history is provided by his wife and daughter. They reported that the patient is usually alert and oriented 4 and is mobile with a walker. This morning he got up per his usual routine 6 AM take his medications and then went back to sleep. He did not however wake up as usual an hour later. They attempted to wake him he was fairly nonresponsive and disoriented. At 11:30 when he had still not we'll cannot they decided to bring him to the emergency room. Emergency room evaluation shows urinary tract infection, fever, tachycardia and leukocytosis. He is being admitted for sepsis due to urinary tract infection.  PAST MEDICAL HISTORY:   Past Medical History  Diagnosis Date  . Coronary artery disease   . Hypertension   . Peripheral vascular disease ([place])   . Chronic kidney disease   . Diabetes mellitus without complication ([place])   . Cancer ([place])     left face squamous cell, prostate, bladder    PAST SURGICAL HISTORY:   Past Surgical History  Procedure Laterality Date  . Coronary artery bypass graft    . Eye surgery    . Hernia repair    . Nephrectomy Left     SOCIAL HISTORY:   Social History  Substance Use  Topics  . Smoking status: Former Smoker    Types: Cigarettes  . Smokeless tobacco: Never Used  . Alcohol Use: No    FAMILY HISTORY:   Family History  Problem Relation Age of Onset  . Breast cancer Sister     DRUG ALLERGIES:  No Known Allergies  REVIEW OF SYSTEMS:   ROS unable to obtain due to altered mental status  MEDICATIONS AT HOME:   Prior to Admission medications   Medication Sig Start Date End Date Taking? Authorizing Provider  chlorhexidine (PERIDEX) 0.12 % solution Use as directed 15 mLs in the mouth or throat 2 (two) times daily.   Yes Historical Provider, MD  dextromethorphan-guaiFENesin (TUSSIN DM) 10-100 MG/5ML liquid Take 5 mLs by mouth every 4 (four) hours as needed for cough.    Yes Historical Provider, MD  finasteride (PROSCAR) 5 MG tablet Take 5 mg by mouth daily.   Yes Historical Provider, MD  HYDROmorphone (DILAUDID) 4 MG tablet Take 4-8 mg by mouth every 3 (three) hours as needed for severe pain.   Yes Historical Provider, MD  hypromellose (SYSTANE OVERNIGHT THERAPY) 0.3 % GEL ophthalmic ointment Place 1 application into both eyes at bedtime.   Yes Historical Provider, MD  methenamine (HIPREX) 1 G tablet Take 1 g by mouth 4 (four) times daily.   Yes Historical Provider, MD  nystatin (MYCOSTATIN) 100000 UNIT/ML suspension Take 5 mLs by mouth 2 (two) times daily.   Yes Historical Provider,  MD  oxymorphone (OPANA ER) 30 MG 12 hr tablet Take 60 mg by mouth 3 (three) times daily.   Yes Historical Provider, MD  pantoprazole (PROTONIX) 40 MG tablet Take 40 mg by mouth daily.   Yes Historical Provider, MD  Polyethyl Glycol-Propyl Glycol (SYSTANE OP) Place 1 drop into both eyes as needed (for dry eyes).    Yes Historical Provider, MD      VITAL SIGNS:  Blood pressure 112/76, pulse 87, temperature 101 F (38.3 C), temperature source Oral, resp. rate 16, height 5\' 11"  (1.803 m), weight 83.915 kg (185 lb), SpO2 100 %.  PHYSICAL EXAMINATION:  GENERAL:  81 y.o.-year-old patient lying in the bed with no acute distress.  EYES: Right pupil is round and reactive, extra examination intact on the right, left eye cloudy, redness of the eyelid eyelid, conjunctival inflammation HEENT: Deep wounds over the left temporal and periauricular area with no surrounding erythema, some beige drainage without odor, there is a protuberance over the left mandible which is slightly tender, mucous membranes are dry, tongue is black tarry, posterior oropharynx is clear NECK:  Supple, no jugular venous distention. No thyroid enlargement, no tenderness.  LUNGS: Short shallow respirations, fine diffuse crackles, no wheezes or rhonchi, no respiratory distress CARDIOVASCULAR: S1, S2 normal. No murmurs, rubs, or gallops. Tachycardic ABDOMEN: Soft, nontender, nondistended. Bowel sounds present. No organomegaly or mass. No guarding no rebound EXTREMITIES: +1 pedal edema left ankle, none on the right, no cyanosis, or clubbing.  NEUROLOGIC: Cranial nerves II through XII are grossly intact. Muscle strength 5/5 in all extremities. Sensation intact. Gait not checked.  PSYCHIATRIC: The patient is alert and oriented to person SKIN: As mentioned above deep skin wounds on the left face, senile purpura over both forearms  LABORATORY PANEL:   CBC  Recent Labs Lab 03/26/15 1320  WBC 13.6*  HGB 9.9*  HCT 30.6*  PLT 272   ------------------------------------------------------------------------------------------------------------------  Chemistries   Recent Labs Lab 03/26/15 1320  NA 139  K 4.2  CL 107  CO2 22  GLUCOSE 150*  BUN 57*  CREATININE 2.35*  CALCIUM 9.9  AST 20  ALT 23  ALKPHOS 99  BILITOT 0.1*   ------------------------------------------------------------------------------------------------------------------  Cardiac Enzymes  Recent Labs Lab 03/26/15 1320  TROPONINI 0.03    ------------------------------------------------------------------------------------------------------------------  RADIOLOGY:  Dg Chest 2 View  03/26/2015  CLINICAL DATA:  Cough. EXAM: CHEST  2 VIEW COMPARISON:  February 11, 2007. FINDINGS: The heart size and mediastinal contours are within normal limits. Both lungs are clear. No pneumothorax or pleural effusion is noted. Sternotomy wires are noted. The visualized skeletal structures are unremarkable. IMPRESSION: No active cardiopulmonary disease. Electronically Signed   By: Marijo Conception, M.D.   On: 03/26/2015 14:32    EKG:   Orders placed or performed during the hospital encounter of 03/26/15  . EKG 12-Lead  . EKG 12-Lead  . EKG 12-Lead  . EKG 12-Lead    IMPRESSION AND PLAN:   #1 sepsis: Meeting criteria tachycardia, fever, initial hypotension. Lactic acid is normal. White count mildly elevated at this patient is elderly and immunocompromised. Source seems to be urinary tract infection. He is being treated with standard emergency room protocol for volume replacement. Blood cultures urine cultures are pending.  #2 urinary tract infection: Patient has history of resistant Escherichia coli per ID notes. He had been on suppressive methenamine most recently. Urine and blood cultures are pending. We'll treat empirically with vancomycin and Zosyn.  #3 acute on chronic kidney disease  stage III: This is likely due to active infection, hypertension dehydration. Baseline creatinine about 1.8, today 2.35 We'll continue with volume replacement. We'll also obtain a renal ultrasound. He is status post left nephrectomy. Hold nephrotoxic agents  #4 acute anemia: Baseline hemoglobin 13.4 today is 9.9. No bleeding noted. Platelets normal. Will check an anemia panel. This may be related to his chronic cancer.  #5 coronary artery disease status post CABG: No chest pain no shortness of breath. Not on any related medications at this time. Not following  with cardiology. No EKG changes. Monitor on telemetry due to tachycardia.  #6 chronic pain due to. 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 Take hemoglobin A1c and monitor   All the records are reviewed and case discussed with ED provider. Management plans discussed with the patient, wife and daughter and they are in agreement.  CODE STATUS: Full code. Family states that they have not discussed CODE STATUS or living well. I encouraged him to do so.  TOTAL TIME TAKING CARE OF THIS PATIENT: 45 minutes.  Greater than 50% of time spent in coordination of care and counseling.  Myrtis Ser M.D on 03/26/2015 at 4:27 PM  Between 7am to 6pm - Pager - [phone]  After 6pm go to [email or web address] - password [email or web address] [facility]  [facility]  Office  [phone]  CC: Primary care physician; Rusty Aus., MD

## 2015-03-26 NOTE — Progress Notes (Signed)
ANTIBIOTIC CONSULT NOTE - INITIAL  Pharmacy Consult for Vancomycin and Zosyn Indication: Sepsis/UTI  No Known Allergies  Patient Measurements: Height: 5\' 11"  (180.3 cm) Weight: 185 lb (83.915 kg) IBW/kg (Calculated) : 75.3   Vital Signs: Temp: 101 F (38.3 C) (11/04 1314) Temp Source: Oral (11/04 1314) BP: 112/76 mmHg (11/04 1615) Pulse Rate: 87 (11/04 1615) Intake/Output from previous day:   Intake/Output from this shift:    Labs:  Recent Labs  03/26/15 1320  WBC 13.6*  HGB 9.9*  PLT 272  CREATININE 2.35*   Estimated Creatinine Clearance: 24.9 mL/min (by C-G formula based on Cr of 2.35). No results for input(s): VANCOTROUGH, VANCOPEAK, VANCORANDOM, GENTTROUGH, GENTPEAK, GENTRANDOM, TOBRATROUGH, TOBRAPEAK, TOBRARND, AMIKACINPEAK, AMIKACINTROU, AMIKACIN in the last 72 hours.   Microbiology: No results found for this or any previous visit (from the past 720 hour(s)).  Medical History: Past Medical History  Diagnosis Date  . Coronary artery disease   . Hypertension   . Peripheral vascular disease ([place])   . Chronic kidney disease   . Diabetes mellitus without complication ([place])   . Cancer [facility])     left face squamous cell, prostate, bladder    Assessment: 81 yo male presenting to ED with fever, tachycardia and leukocytosis. Pharmacy consulted for Vancomycin and Zosyn monitoring and dosing for Sepsis due to UTI.  UA showing WBC TNTC, rare bacteria and positive leukocytes.   Temp 101, WBC 13.6, CrCl 33ml/ml, Scr 2.35.  Patient received 1 dose of Zosyn 3.375 and Vancomycin 1g in ED.  Goal of Therapy:  Vancomycin trough level 15-20 mcg/ml  Plan:  Ke: 0.025  VD: 59   T1/2: 28  Will start patient on vancomycin regimen of 1250mg  every 36 hours with 6 hour stack dosing. Trough ordered prior to 4th dose on 03/28/15 at 0930. Will start Zosyn 3.375 extended infusion every 8 hours.  Pharmacy will continue to monitor labs and renal function and make adjustments as  needed.   Nancy Fetter, PharmD Pharmacy Resident

## 2015-03-27 ENCOUNTER — Inpatient Hospital Stay: Payer: Medicare Other

## 2015-03-27 DIAGNOSIS — A419 Sepsis, unspecified organism: Secondary | ICD-10-CM | POA: Diagnosis not present

## 2015-03-27 LAB — BASIC METABOLIC PANEL
Anion gap: 5 (ref 5–15)
BUN: 51 mg/dL — AB (ref 6–20)
CALCIUM: 8.8 mg/dL — AB (ref 8.9–10.3)
CHLORIDE: 115 mmol/L — AB (ref 101–111)
CO2: 21 mmol/L — ABNORMAL LOW (ref 22–32)
CREATININE: 2.17 mg/dL — AB (ref 0.61–1.24)
GFR, EST AFRICAN AMERICAN: 30 mL/min — AB (ref >60)
GFR, EST NON AFRICAN AMERICAN: 26 mL/min — AB (ref >60)
Glucose, Bld: 114 mg/dL — ABNORMAL HIGH (ref 65–99)
Potassium: 3.7 mmol/L (ref 3.5–5.1)
SODIUM: 141 mmol/L (ref 135–145)

## 2015-03-27 LAB — CBC
HCT: 25 % — ABNORMAL LOW (ref 40.0–52.0)
Hemoglobin: 8.3 g/dL — ABNORMAL LOW (ref 13.0–18.0)
MCH: 29.2 pg (ref 26.0–34.0)
MCHC: 33.1 g/dL (ref 32.0–36.0)
MCV: 88.3 fL (ref 80.0–100.0)
PLATELETS: 200 10*3/uL (ref 150–440)
RBC: 2.83 MIL/uL — ABNORMAL LOW (ref 4.40–5.90)
RDW: 15.3 % — ABNORMAL HIGH (ref 11.5–14.5)
WBC: 8.8 10*3/uL (ref 3.8–10.6)

## 2015-03-27 LAB — VITAMIN B12: Vitamin B-12: 320 pg/mL (ref 180–914)

## 2015-03-27 MED ORDER — VANCOMYCIN HCL 10 G IV SOLR
1250.0000 mg | INTRAVENOUS | Status: DC
  Filled 2015-03-27: qty 1250

## 2015-03-27 MED ORDER — ENSURE ENLIVE PO LIQD
237.0000 mL | Freq: Three times a day (TID) | ORAL | Status: DC
  Administered 2015-03-27 – 2015-03-28 (×2): 237 mL via ORAL

## 2015-03-27 NOTE — Progress Notes (Signed)
[facility] at [facility]                                                                                                                                                                                            Patient Demographics   Gregory Luna, is a 81 y.o. male, DOB - 03-Mar-1929, QMG:[id number]  Admit date - 03/26/2015   Admitting Physician Aldean Jewett, MD  Outpatient Primary MD for the patient is Rusty Aus., MD   LOS - 1  Subjective: Patient is mentally much better. No further fevers or chills complains of pain in his foot bilaterally and neck.     Review of Systems:   CONSTITUTIONAL: No documented fever. No fatigue, weakness. No weight gain, no weight loss.  EYES: No blurry or double vision.  ENT: No tinnitus. No postnasal drip. No redness of the oropharynx.  RESPIRATORY: No cough, no wheeze, no hemoptysis. No dyspnea.  CARDIOVASCULAR: No chest pain. No orthopnea. No palpitations. No syncope.  GASTROINTESTINAL: No nausea, no vomiting or diarrhea. No abdominal pain. No melena or hematochezia.  GENITOURINARY: No dysuria or hematuria.  ENDOCRINE: No polyuria or nocturia. No heat or cold intolerance.  HEMATOLOGY: No anemia. No bruising. No bleeding.  INTEGUMENTARY: No rashes. No lesions.  MUSCULOSKELETAL: No arthritis. No swelling. No gout. Complains of bilateral foot pain NEUROLOGIC: No numbness, tingling, or ataxia. No seizure-type activity.  PSYCHIATRIC: No anxiety. No insomnia. No ADD.    Vitals:   Filed Vitals:   03/26/15 1737 03/26/15 2136 03/27/15 0553 03/27/15 0902  BP: 112/55 102/50 105/46 108/51  Pulse: 81 69 63 69  Temp: 98.6 F (37 C) 98.3 F (36.8 C) 98.1 F (36.7 C) 97.9 F (36.6 C)  TempSrc: Oral Oral Oral Oral  Resp:      Height: 6' (1.829 m)     Weight: 74.889 kg (165 lb 1.6 oz)     SpO2: 97% 99% 99% 100%    Wt Readings from Last 3 Encounters:  03/26/15 74.889 kg (165 lb 1.6 oz)  11/29/14 79.379  kg (175 lb)     Intake/Output Summary (Last 24 hours) at 03/27/15 1318 Last data filed at 03/27/15 3267  Gross per 24 hour  Intake   1475 ml  Output      0 ml  Net   1475 ml    Physical Exam:   GENERAL: Pleasant-appearing in no apparent distress.  HEAD, EYES, EARS, NOSE AND THROAT: Atraumatic, normocephalic. Extraocular muscles are intact. Pupils equal and reactive to light. Sclerae anicteric. No conjunctival injection. No oro-pharyngeal erythema. Wound covering left temporal area NECK: Supple. There  is no jugular venous distention. No bruits, no lymphadenopathy, no thyromegaly.  HEART: Regular rate and rhythm,. No murmurs, no rubs, no clicks.  LUNGS: Clear to auscultation bilaterally. No rales or rhonchi. No wheezes.  ABDOMEN: Soft, flat, nontender, nondistended. Has good bowel sounds. No hepatosplenomegaly appreciated.  EXTREMITIES: No evidence of any cyanosis, clubbing, or peripheral edema.  +2 pedal and radial pulses bilaterally.  NEUROLOGIC: The patient is alert, awake, and oriented x3 with no focal motor or sensory deficits appreciated bilaterally.  SKIN: Moist and warm with no rashes appreciated.  Psych: Not anxious, depressed LN: No inguinal LN enlargement    Antibiotics   Anti-infectives    Start     Dose/Rate Route Frequency Ordered Stop   03/26/15 2300  piperacillin-tazobactam (ZOSYN) IVPB 3.375 g     3.375 g 12.5 mL/hr over 240 Minutes Intravenous 3 times per day 03/26/15 1744     03/26/15 2200  vancomycin (VANCOCIN) 1,250 mg in sodium chloride 0.9 % 250 mL IVPB     1,250 mg 166.7 mL/hr over 90 Minutes Intravenous Every 18 hours 03/26/15 1744     03/26/15 1515  piperacillin-tazobactam (ZOSYN) IVPB 3.375 g     3.375 g 100 mL/hr over 30 Minutes Intravenous  Once 03/26/15 1513 03/26/15 1725   03/26/15 1515  vancomycin (VANCOCIN) IVPB 1000 mg/200 mL premix     1,000 mg 200 mL/hr over 60 Minutes Intravenous  Once 03/26/15 1513 03/26/15 1725      Medications    Scheduled Meds: . artificial tears   Both Eyes QHS  . chlorhexidine  15 mL Mouth/Throat BID  . docusate sodium  100 mg Oral BID  . finasteride  5 mg Oral Daily  . heparin  5,000 Units Subcutaneous 3 times per day  . morphine  30 mg Oral Q12H  . nystatin  5 mL Oral BID  . pantoprazole  40 mg Oral Daily  . piperacillin-tazobactam (ZOSYN)  IV  3.375 g Intravenous 3 times per day  . sodium chloride  3 mL Intravenous Q12H  . vancomycin  1,250 mg Intravenous Q18H   Continuous Infusions: . sodium chloride 100 mL/hr at 03/27/15 0403   PRN Meds:.acetaminophen **OR** acetaminophen, dextromethorphan-guaiFENesin, HYDROmorphone, ondansetron **OR** ondansetron (ZOFRAN) IV, Polyethyl Glycol-Propyl Glycol   Data Review:   Micro Results Recent Results (from the past 240 hour(s))  Culture, blood (routine x 2)     Status: None (Preliminary result)   Collection Time: 03/26/15  1:20 PM  Result Value Ref Range Status   Specimen Description BLOOD RIGHT AC  Final   Special Requests BOTTLES DRAWN AEROBIC AND ANAEROBIC  4CC  Final   Culture NO GROWTH < 24 HOURS  Final   Report Status PENDING  Incomplete  Culture, blood (routine x 2)     Status: None (Preliminary result)   Collection Time: 03/26/15  3:25 PM  Result Value Ref Range Status   Specimen Description BLOOD LEFT FOREARM  Final   Special Requests BOTTLES DRAWN AEROBIC AND ANAEROBIC 1CC  Final   Culture NO GROWTH < 24 HOURS  Final   Report Status PENDING  Incomplete    Radiology Reports Dg Chest 2 View  03/26/2015  CLINICAL DATA:  Cough. EXAM: CHEST  2 VIEW COMPARISON:  February 11, 2007. FINDINGS: The heart size and mediastinal contours are within normal limits. Both lungs are clear. No pneumothorax or pleural effusion is noted. Sternotomy wires are noted. The visualized skeletal structures are unremarkable. IMPRESSION: No active cardiopulmonary disease. Electronically Signed  By: Marijo Conception, M.D.   On: 03/26/2015 14:32   US  Renal  03/27/2015  CLINICAL DATA:  Acute renal failure EXAM: RENAL / URINARY TRACT ULTRASOUND COMPLETE COMPARISON:  04/14/2014 FINDINGS: Right Kidney: Length: 13.2 cm. Moderate hydronephrosis. There is a lower pole cyst which measures 1.7 x 1.6 x 1.7 cm. Left Kidney: No left kidney. Bladder: Debris identified within the urinary bladder. IMPRESSION: 1. Hydronephrotic solitary right kidney. Electronically Signed   By: Kerby Moors M.D.   On: 03/27/2015 12:31     CBC  Recent Labs Lab 03/26/15 1320 03/27/15 0434  WBC 13.6* 8.8  HGB 9.9* 8.3*  HCT 30.6* 25.0*  PLT 272 200  MCV 88.1 88.3  MCH 28.6 29.2  MCHC 32.4 33.1  RDW 15.6* 15.3*  LYMPHSABS 1.0  --   MONOABS 1.3*  --   EOSABS 0.1  --   BASOSABS 0.0  --     Chemistries   Recent Labs Lab 03/26/15 1320 03/27/15 0434  NA 139 141  K 4.2 3.7  CL 107 115*  CO2 22 21*  GLUCOSE 150* 114*  BUN 57* 51*  CREATININE 2.35* 2.17*  CALCIUM 9.9 8.8*  AST 20  --   ALT 23  --   ALKPHOS 99  --   BILITOT 0.1*  --    ------------------------------------------------------------------------------------------------------------------ estimated creatinine clearance is 26.8 mL/min (by C-G formula based on Cr of 2.17). ------------------------------------------------------------------------------------------------------------------ No results for input(s): HGBA1C in the last 72 hours. ------------------------------------------------------------------------------------------------------------------ No results for input(s): CHOL, HDL, LDLCALC, TRIG, CHOLHDL, LDLDIRECT in the last 72 hours. ------------------------------------------------------------------------------------------------------------------ No results for input(s): TSH, T4TOTAL, T3FREE, THYROIDAB in the last 72 hours.  Invalid input(s): FREET3 ------------------------------------------------------------------------------------------------------------------  Recent Labs   03/26/15 1320  FOLATE 11.3  FERRITIN 1048*  TIBC 181*  IRON 17*  RETICCTPCT 2.1    Coagulation profile  Recent Labs Lab 03/26/15 1320  INR 1.13    No results for input(s): DDIMER in the last 72 hours.  Cardiac Enzymes  Recent Labs Lab 03/26/15 1320  TROPONINI 0.03   ------------------------------------------------------------------------------------------------------------------ Invalid input(s): POCBNP    Assessment & Plan   Active Problems:   Sepsis ([place]) #1 sepsis: Meeting criteria tachycardia, fever, initial hypotension. Due to recurrent urinary tract infection cultures currently pending continue broad-spectrum antibiotics we will they are down the antibiotics once results available   #2 urinary tract infection: Patient has history of resistant Escherichia coli per ID notes. He had been on suppressive methenamine most recently. 100 cultures to decide on the final antibiotics   #3 acute on chronic kidney disease stage III: This is likely due to active infection,dehydration. Improved with IV fluids  #4 acute anemia: Baseline hemoglobin 13.4 today is 9.9. No bleeding noted. Platelets normal. Follow CBC likely due to anemia of chronic disease   #5 coronary artery disease status post CABG: Asymptomatic   #6 chronic pain due to. 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 Continue to monitor    Code Status Orders        Start     Ordered   03/26/15 1745  Full code   Continuous     03/26/15 1744           Consults  none  DVT Prophylaxis  Heparin   Lab Results  Component Value Date   PLT 200 03/27/2015     Time Spent in minutes  35 minutes  Dustin Flock M.D on 03/27/2015 at 1:18 PM  Between 7am to 6pm - Pager - [phone]  After 6pm go to [email or web address] - password [email or web address] [facility]    Office  [phone]

## 2015-03-27 NOTE — Progress Notes (Signed)
PT Cancellation Note  Patient Details Name: Gregory Luna MRN: [id number] DOB: June 21, 1928   Cancelled Treatment:    Reason Eval/Treat Not Completed: Patient at procedure or test/unavailable Patient was getting ready to be taken for procedure/testing at time of PT evaluation. Will check back when patient is available.    [name] 03/27/2015, 11:56 AM

## 2015-03-27 NOTE — Progress Notes (Signed)
Initial Nutrition Assessment   Non severe (moderate) malnutrition in context of chronic illness  INTERVENTION:  Meals and snacks: Cater to pt preferences, recommend dysphagia 3 diet (chopped)  Medical Nutrition Supplement Therapy: Recommend Ensure Enlive po TID, each supplement provides 350 kcal and 20 grams of protein    NUTRITION DIAGNOSIS:   Inadequate oral intake related to acute illness as evidenced by per patient/family report.    GOAL:   Patient will meet greater than or equal to 90% of their needs    MONITOR:    (Energy intake, Electrolyte and renal profile)  REASON FOR ASSESSMENT:   Malnutrition Screening Tool    ASSESSMENT:      Pt admitted with weakness, AMS, sepsis UTI  Past Medical History  Diagnosis Date  . Coronary artery disease   . Hypertension   . Peripheral vascular disease ([place])   . Chronic kidney disease   . Diabetes mellitus without complication ([place])   . Cancer ([place])     left face squamous cell, prostate, bladder    Current Nutrition: ate mashed potatoes for lunch today, meat to tough for pt to chew.   Food/Nutrition-Related History: Dtr and wife report overall good appetite. Tends to eat breakfast best with limited intake during the day and fairly good supper. Drinks 3-4 boost per day   Scheduled Medications:  . artificial tears   Both Eyes QHS  . chlorhexidine  15 mL Mouth/Throat BID  . docusate sodium  100 mg Oral BID  . feeding supplement (ENSURE ENLIVE)  237 mL Oral TID  . finasteride  5 mg Oral Daily  . heparin  5,000 Units Subcutaneous 3 times per day  . morphine  30 mg Oral Q12H  . nystatin  5 mL Oral BID  . pantoprazole  40 mg Oral Daily  . piperacillin-tazobactam (ZOSYN)  IV  3.375 g Intravenous 3 times per day  . sodium chloride  3 mL Intravenous Q12H  . [START ON 03/28/2015] vancomycin  1,250 mg Intravenous Q36H    Continuous Medications:  . sodium chloride 100 mL/hr at 03/27/15 1423     Electrolyte/Renal  Profile and Glucose Profile:   Recent Labs Lab 03/26/15 1320 03/27/15 0434  NA 139 141  K 4.2 3.7  CL 107 115*  CO2 22 21*  BUN 57* 51*  CREATININE 2.35* 2.17*  CALCIUM 9.9 8.8*  GLUCOSE 150* 114*   Protein Profile:  Recent Labs Lab 03/26/15 1320  ALBUMIN 2.6*    Gastrointestinal Profile: Last BM:11/3   Nutrition-Focused Physical Exam Findings: Nutrition-Focused physical exam completed. Findings are normal - mild/moderate fat depletion, normal to mild/moderate muscle depletion, and no edema.      Weight Change: 6% weight loss in the last 4 months per wt encounters    Diet Order:  DIET DYS 3 Room service appropriate?: Yes; Fluid consistency:: Thin  Skin:   reviewed   Height:   Ht Readings from Last 1 Encounters:  03/26/15 6' (1.829 m)    Weight:   Wt Readings from Last 1 Encounters:  03/26/15 165 lb 1.6 oz (74.889 kg)     BMI:  Body mass index is 22.39 kg/(m^2).  Estimated Nutritional Needs:   Kcal:  BEE 1478 kcals (IF 1.1-1.3, AF 1.2)1950-2305 kcals/d.   Protein:  (1.1-1.3 g/kg) 83-98 g/d  Fluid:  (25-58ml/kg) 1875-2250ml/d  EDUCATION NEEDS:   No education needs identified at this time  [place]. Zenia Resides, [place], [place] (pager)

## 2015-03-27 NOTE — Plan of Care (Signed)
Problem: Safety: Goal: Ability to remain free from injury will improve Outcome: Progressing Pt A & O, wife at bedside, no c/o pain nor distress. Iv fluids and antbx. On isolation to r/o MRSA. Continue to monitor.

## 2015-03-27 NOTE — Progress Notes (Signed)
ANTIBIOTIC CONSULT NOTE - INITIAL  Pharmacy Consult for Vancomycin and Zosyn Indication: Sepsis/UTI  No Known Allergies  Patient Measurements: Height: 6' (182.9 cm) Weight: 165 lb 1.6 oz (74.889 kg) IBW/kg (Calculated) : 77.6   Vital Signs: Temp: 97.9 F (36.6 C) (11/05 0902) Temp Source: Oral (11/05 0902) BP: 108/51 mmHg (11/05 0902) Pulse Rate: 69 (11/05 0902) Intake/Output from previous day: 11/04 0701 - 11/05 0700 In: 1475 [I.V.:1175; IV Piggyback:300] Out: -  Intake/Output from this shift:    Labs:  Recent Labs  03/26/15 1320 03/27/15 0434  WBC 13.6* 8.8  HGB 9.9* 8.3*  PLT 272 200  CREATININE 2.35* 2.17*   Estimated Creatinine Clearance: 26.8 mL/min (by C-G formula based on Cr of 2.17). No results for input(s): VANCOTROUGH, VANCOPEAK, VANCORANDOM, GENTTROUGH, GENTPEAK, GENTRANDOM, TOBRATROUGH, TOBRAPEAK, TOBRARND, AMIKACINPEAK, AMIKACINTROU, AMIKACIN in the last 72 hours.   Microbiology: Recent Results (from the past 720 hour(s))  Culture, blood (routine x 2)     Status: None (Preliminary result)   Collection Time: 03/26/15  1:20 PM  Result Value Ref Range Status   Specimen Description BLOOD RIGHT AC  Final   Special Requests BOTTLES DRAWN AEROBIC AND ANAEROBIC  4CC  Final   Culture NO GROWTH < 24 HOURS  Final   Report Status PENDING  Incomplete  Culture, blood (routine x 2)     Status: None (Preliminary result)   Collection Time: 03/26/15  3:25 PM  Result Value Ref Range Status   Specimen Description BLOOD LEFT FOREARM  Final   Special Requests BOTTLES DRAWN AEROBIC AND ANAEROBIC 1CC  Final   Culture NO GROWTH < 24 HOURS  Final   Report Status PENDING  Incomplete    Medical History: Past Medical History  Diagnosis Date  . Coronary artery disease   . Hypertension   . Peripheral vascular disease ([place])   . Chronic kidney disease   . Diabetes mellitus without complication ([place])   . Cancer [facility])     left face squamous cell, prostate, bladder     Assessment: 81 yo male presenting to ED with fever, tachycardia and leukocytosis. Pharmacy consulted for Vancomycin and Zosyn monitoring and dosing for Sepsis due to UTI.  UA showing WBC TNTC, rare bacteria and positive leukocytes.   Temp 101, WBC 13.6, CrCl 48ml/ml, Scr 2.35.  Patient received 1 dose of Zosyn 3.375 and Vancomycin 1g in ED.  Goal of Therapy:  Vancomycin trough level 15-20 mcg/ml  Plan:  Ke: 0.025  VD: 59   T1/2: 28 Will continue vancomcyin 1250mg  Q36H which should result in target trough 15-75mcg/ml. Trough ordered prior to third dose on 11/7, which will not be quite steady state.  Continue zosyn 3.375gm IV Q8H extended infusion  Pharmacy to follow per consult.   Rexene Edison, PharmD Clinical Pharmacist  03/27/2015 2:20 PM

## 2015-03-28 DIAGNOSIS — E440 Moderate protein-calorie malnutrition: Secondary | ICD-10-CM | POA: Insufficient documentation

## 2015-03-28 LAB — CREATININE, SERUM
Creatinine, Ser: 2.31 mg/dL — ABNORMAL HIGH (ref 0.61–1.24)
GFR, EST AFRICAN AMERICAN: 28 mL/min — AB (ref >60)
GFR, EST NON AFRICAN AMERICAN: 24 mL/min — AB (ref >60)

## 2015-03-28 MED ORDER — AMOXICILLIN-POT CLAVULANATE 875-125 MG PO TABS
1.0000 | ORAL_TABLET | Freq: Two times a day (BID) | ORAL | Status: DC
Start: 2015-03-28 — End: 2015-04-01

## 2015-03-28 NOTE — Discharge Summary (Signed)
Gregory Luna, 81 y.o., DOB 05/20/1929, MRN [id number]. Admission date: 03/26/2015 Discharge Date 03/28/2015 Primary MD MILLER,MARK F., MD Admitting Physician Catherine P Walsh, MD  Admission Diagnosis  Delirium [R41.0] Acute renal insufficiency [N28.9] Complicated UTI (urinary tract infection) [N39.0] Acute kidney failure (HCC) [N17.9] Sepsis, due to unspecified organism (HCC) [A41.9] Fever, unspecified fever cause [R50.9]  Discharge Diagnosis   Active Problems:   Sepsis (HCC) suspected due to urinary source although urine cultures negative   Malnutrition of moderate degree Acute on chronic kidney disease stage III renal function stable Anemia Chronic hydronephrosis noted on ultrasound the kidneys Coronary artery disease status post CABG 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 Patient met criteria for sepsis the source was felt to be due to urinary tract infection. He was started on broad-spectrum antibiotics. He also had fever on presentation which resolved. His WBC count normalized. Blood cultures are negative. Urine cultures have been negative to date. Patient was noted to acute renal failure had  renal ultrasound which showed chronic hydronephrosis. Patient is very weak and home health and physical therapy has been arranged. Patient does have some cough. If continues consider swallow eval as outpatient.          Consults  None  Significant Tests:  See full reports for all details     Dg Chest 2 View  03/26/2015  CLINICAL DATA:  Cough. EXAM: CHEST  2 VIEW COMPARISON:  February 11, 2007. FINDINGS: The heart size and mediastinal contours are within normal limits. Both lungs are clear. No pneumothorax or pleural effusion is noted. Sternotomy wires are noted. The visualized skeletal structures are unremarkable. IMPRESSION: No active cardiopulmonary disease. Electronically Signed   By: James  Green Jr, M.D.   On: 03/26/2015 14:32   Us Renal  03/27/2015  CLINICAL DATA:  Acute renal failure EXAM: RENAL / URINARY TRACT ULTRASOUND COMPLETE COMPARISON:  04/14/2014 FINDINGS: Right Kidney: Length: 13.2 cm. Moderate hydronephrosis. There is a lower pole cyst which measures 1.7 x 1.6 x 1.7 cm. Left Kidney: No left kidney. Bladder: Debris identified within the urinary bladder. IMPRESSION: 1. Hydronephrotic solitary right kidney. Electronically Signed   By: Taylor  Stroud M.D.   On: 03/27/2015 12:31       Today   Subjective:   Gregory Luna patient very anxious to go home feeling better  Objective:   Blood pressure 115/41, pulse 66, temperature 98.3 F (36.8 C), temperature source Oral, resp. rate 16, height 6' (1.829 m), weight 74.889 kg (165 lb 1.6 oz), SpO2 100 %.  .  Intake/Output Summary (Last 24 hours) at 03/28/15 1225 Last data filed at 03/27/15 2209  Gross per 24 hour  Intake    450 ml  Output      0 ml  Net    450 ml    Exam VITAL SIGNS: Blood pressure 115/41, pulse 66, temperature 98.3 F (36.8 C), temperature source Oral, resp. rate 16, height 6' (1.829 m), weight 74.889 kg (165 lb 1.6 oz), SpO2 100 %.  GENERAL:  81 y.o.-year-old patient lying in the bed   with no acute distress.  EYES: Pupils equal, round, reactive to light and accommodation. No scleral icterus. Extraocular muscles intact. Left temple has dressing in place  HEENT: Head atraumatic, normocephalic. Oropharynx and nasopharynx clear.  NECK:  Supple, no jugular venous distention. No thyroid  enlargement, no tenderness.  LUNGS: Normal breath sounds bilaterally, no wheezing, rales,rhonchi or crepitation. No use of accessory muscles of respiration.  CARDIOVASCULAR: S1, S2 normal. No murmurs, rubs, or gallops.  ABDOMEN: Soft, nontender, nondistended. Bowel sounds present. No organomegaly or mass.  EXTREMITIES: No pedal edema, cyanosis, or clubbing.  NEUROLOGIC: Cranial nerves II through XII are intact. Muscle strength 5/5 in all extremities. Sensation intact. Gait not checked.  PSYCHIATRIC: The patient is alert and oriented x 3.  SKIN: No obvious rash, lesion, or ulcer.   Data Review     CBC w Diff: Lab Results  Component Value Date   WBC 8.8 03/27/2015   WBC 5.9 01/14/2013   HGB 8.3* 03/27/2015   HGB 12.8* 01/14/2013   HCT 25.0* 03/27/2015   HCT 36.4* 01/14/2013   PLT 200 03/27/2015   PLT 117* 01/14/2013   LYMPHOPCT 8 03/26/2015   LYMPHOPCT 34.1 01/14/2013   MONOPCT 10 03/26/2015   MONOPCT 12.6 01/14/2013   EOSPCT 1 03/26/2015   EOSPCT 2.9 01/14/2013   BASOPCT 0 03/26/2015   BASOPCT 0.4 01/14/2013   CMP: Lab Results  Component Value Date   NA 141 03/27/2015   NA 137 01/14/2013   K 3.7 03/27/2015   K 4.5 01/14/2013   CL 115* 03/27/2015   CL 106 01/14/2013   CO2 21* 03/27/2015   CO2 28 01/14/2013   BUN 51* 03/27/2015   BUN 23* 01/14/2013   CREATININE 2.31* 03/28/2015   CREATININE 1.82* 01/14/2013   PROT 7.6 03/26/2015   ALBUMIN 2.6* 03/26/2015   BILITOT 0.1* 03/26/2015   ALKPHOS 99 03/26/2015   AST 20 03/26/2015   ALT 23 03/26/2015  .  Micro Results Recent Results (from the past 240 hour(s))  Culture, blood (routine x 2)     Status: None (Preliminary result)   Collection Time: 03/26/15  1:20 PM  Result Value Ref Range Status   Specimen Description BLOOD RIGHT AC  Final   Special Requests BOTTLES DRAWN AEROBIC AND ANAEROBIC  4CC  Final   Culture NO GROWTH 2 DAYS  Final   Report Status PENDING  Incomplete  Urine culture     Status: None  (Preliminary result)   Collection Time: 03/26/15  2:58 PM  Result Value Ref Range Status   Specimen Description URINE, RANDOM  Final   Special Requests NONE  Final   Culture   Final    >=100,000 COLONIES/mL PSEUDOMONAS AERUGINOSA SUSCEPTIBILITIES TO FOLLOW    Report Status PENDING  Incomplete  Culture, blood (routine x 2)     Status: None (Preliminary result)   Collection Time: 03/26/15  3:25 PM  Result Value Ref Range Status   Specimen Description BLOOD LEFT FOREARM  Final   Special Requests BOTTLES DRAWN AEROBIC AND ANAEROBIC 1CC  Final   Culture NO GROWTH 2 DAYS  Final   Report Status PENDING  Incomplete        Code Status Orders        Start     Ordered   03/26/15 1745  Full code   Continuous     03/26/15 1744            Discharge Medications     Medication List  TAKE these medications        amoxicillin-clavulanate 875-125 MG tablet  Commonly known as:  AUGMENTIN  Take 1 tablet by mouth 2 (two) times daily.     chlorhexidine 0.12 % solution  Commonly known as:  PERIDEX  Use as directed 15 mLs in the mouth or throat 2 (two) times daily.     finasteride 5 MG tablet  Commonly known as:  PROSCAR  Take 5 mg by mouth daily.     HYDROmorphone 4 MG tablet  Commonly known as:  DILAUDID  Take 4-8 mg by mouth every 3 (three) hours as needed for severe pain.     methenamine 1 G tablet  Commonly known as:  HIPREX  Take 1 g by mouth 4 (four) times daily.     nystatin 100000 UNIT/ML suspension  Commonly known as:  MYCOSTATIN  Take 5 mLs by mouth 2 (two) times daily.     oxymorphone 30 MG 12 hr tablet  Commonly known as:  OPANA ER  Take 60 mg by mouth 3 (three) times daily.     pantoprazole 40 MG tablet  Commonly known as:  PROTONIX  Take 40 mg by mouth daily.     SYSTANE OP  Place 1 drop into both eyes as needed (for dry eyes).     SYSTANE OVERNIGHT THERAPY 0.3 % Gel ophthalmic ointment  Generic drug:  hypromellose  Place 1 application into  both eyes at bedtime.     TUSSIN DM 10-100 MG/5ML liquid  Generic drug:  dextromethorphan-guaiFENesin  Take 5 mLs by mouth every 4 (four) hours as needed for cough.           Total Time in preparing paper work, data evaluation and todays exam - 35 minutes  PATEL, SHREYANG M.D on 03/28/2015 at 12:25 PM  [facility]   Office  [phone] 

## 2015-03-28 NOTE — Evaluation (Signed)
Physical Therapy Evaluation Patient Details Name: Gregory Luna MRN: [id number] DOB: 18-Dec-1928 Today's Date: 03/28/2015   History of Present Illness  Gregory Luna is a 81 y.o. 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 He was diagnosed with UTI upon admission to ED.   Clinical Impression  81 yo Male was brought to ED with altered mental status. Patient was diagnosed with UTI upon admission. Prior to admittance patient was living at home with his wife. His wife reports that she has noticed a decline in mobility over last few months. Patient required assistance with all ADLs prior to admittance. He was using a 3 wheeled walker for household ambulation. Patient currently demonstrates some weakness in BLE grossly 4/5. He is CGA to supervision for sit<>stand transfer with RW and CGA for gait. Patient limited to walking a few steps to bedside chair. He demonstrates forward trunk flexed posture and required cues for safety. He would benefit from additional skilled PT intervention to improve LE strength, balance and gait safety.     Follow Up Recommendations Home health PT    Equipment Recommendations  None recommended by PT    Recommendations for Other Services       Precautions / Restrictions Precautions Precautions: Fall Restrictions Weight Bearing Restrictions: No      Mobility  Bed Mobility Overal bed mobility: Needs Assistance Bed Mobility: Supine to Sit     Supine to sit: Supervision     General bed mobility comments: patient able to roll to side and sit edge of bed without bed rails, supervision with min Vcs for hand placement  Transfers Overall  transfer level: Needs assistance Equipment used: Rolling walker (2 wheeled) Transfers: Sit to/from Stand Sit to Stand: Min guard         General transfer comment: Patient transfers sit<>stand with CGA to cose supervision x2 reps with RW, required mod VCs for hand placement  Ambulation/Gait Ambulation/Gait assistance: Min guard Ambulation Distance (Feet): 2 Feet Assistive device: Rolling walker (2 wheeled) Gait Pattern/deviations: Step-to pattern;Decreased dorsiflexion - right;Decreased dorsiflexion - left;Trunk flexed;Decreased step length - left;Decreased step length - right Gait velocity: decreased   General Gait Details: Patient took 2-3 steps to bedisde chair with RW, CGA with cues to stand erect and increase step length; Patient able to demonstrate fair foot clearance;  Stairs            Wheelchair Mobility    Modified Rankin (Stroke Patients Only)       Balance Overall balance assessment: Needs assistance Sitting-balance support: Single extremity supported;Feet supported Sitting balance-Leahy Scale: Fair Sitting balance - Comments: Static sitting balance is good, dynamic sitting balance is fair as patient demonstrates a posterior trunk lean with LE movement Postural control: Posterior lean Standing balance support: Bilateral upper extremity supported Standing balance-Leahy Scale: Poor Standing balance comment: Patient requires CGA for balance safety in standing                             Pertinent Vitals/Pain Pain Assessment: No/denies pain    Home Living Family/patient expects to be discharged to:: Private residence Living Arrangements: Spouse/significant other Available Help at Discharge: Family Type of Home: House Home Access: Stairs to enter  Entrance Stairs-Number of Steps: 1 with ramp Home Layout: One level Home Equipment: Bedside commode;Shower seat Additional Comments: 3 wheeled walker    Prior Function Level of Independence:  Needs assistance   Gait / Transfers Assistance Needed: needed supervision with 3 wheeled walker  ADL's / Homemaking Assistance Needed: needed help from wife with all ADLs  Comments: wife assisted with all ADLs including bathing, transfers, helping him to the bedside commode etc.     Hand Dominance        Extremity/Trunk Assessment   Upper Extremity Assessment: Generalized weakness           Lower Extremity Assessment: RLE deficits/detail;LLE deficits/detail RLE Deficits / Details: RLE hip grossly 4-/5, knee and ankle 4/5; intact light touch sensation LLE Deficits / Details: LLE: hip 4/5, knee and ankle 4/5; intact light touch sensation;     Communication   Communication: HOH  Cognition                            General Comments General comments (skin integrity, edema, etc.): has wound on left side of face; multiple bruises noted on arms; skin intact on legs;    Exercises        Assessment/Plan    PT Assessment Patient needs continued PT services  PT Diagnosis Difficulty walking;Generalized weakness   PT Problem List Decreased strength;Decreased range of motion;Decreased activity tolerance;Decreased balance;Decreased mobility  PT Treatment Interventions Gait training;Stair training;Functional mobility training;Therapeutic activities;Therapeutic exercise;Balance training;Patient/family education;DME instruction   PT Goals (Current goals can be found in the Care Plan section) Acute Rehab PT Goals Patient Stated Goal: to go home PT Goal Formulation: With patient Time For Goal Achievement: 04/04/15 Potential to Achieve Goals: Good    Frequency Min 2X/week   Barriers to discharge Inaccessible home environment patient does have 1 step to enter but wife states that there is a ramp that he could use.    Co-evaluation               End of Session Equipment Utilized During Treatment: Gait belt Activity Tolerance: Patient tolerated treatment  well Patient left: in chair;with call bell/phone within reach;with chair alarm set;with family/visitor present Nurse Communication: Mobility status         Time: 1791-5056 PT Time Calculation (min) (ACUTE ONLY): 27 min   Charges:   PT Evaluation $Initial PT Evaluation Tier I: 1 Procedure     PT G Codes:        Hopkins,Margaret PT, DPT 03/28/2015, 9:52 AM

## 2015-03-28 NOTE — Discharge Instructions (Signed)
°  DIET:  Cardiac diet, aspiration precaustions  DISCHARGE CONDITION:  Stable  ACTIVITY:  Activity as tolerated with walker  OXYGEN:  Home Oxygen: No.   Oxygen Delivery: room air  DISCHARGE LOCATION:  home    ADDITIONAL DISCHARGE INSTRUCTION:[facility] out patient swallow eval    If you experience worsening of your admission symptoms, develop shortness of breath, life threatening emergency, suicidal or homicidal thoughts you must seek medical attention immediately by calling 911 or calling your MD immediately  if symptoms less severe.  You Must read complete instructions/literature along with all the possible adverse reactions/side effects for all the Medicines you take and that have been prescribed to you. Take any new Medicines after you have completely understood and accpet all the possible adverse reactions/side effects.   Please note  You were cared for by a hospitalist during your hospital stay. If you have any questions about your discharge medications or the care you received while you were in the hospital after you are discharged, you can call the unit and asked to speak with the hospitalist on call if the hospitalist that took care of you is not available. Once you are discharged, your primary care physician will handle any further medical issues. Please note that NO REFILLS for any discharge medications will be authorized once you are discharged, as it is imperative that you return to your primary care physician (or establish a relationship with a primary care physician if you do not have one) for your aftercare needs so that they can reassess your need for medications and monitor your lab values.

## 2015-03-28 NOTE — Progress Notes (Signed)
Pt being discharged home with Physical Therapy, discharge paperwork reviewed with family, valuables returened to patient. IV removed. Pt rolled out in wheelchair by staff.

## 2015-03-28 NOTE — Plan of Care (Signed)
Problem: Physical Regulation: Goal: Signs and symptoms of infection will decrease Outcome: Progressing Pt alert and oriented, wife at bedside. Dilaudid prn for pain with relief. Up to the bsc with one assist. Continues on iv antibiotics. Continues on isolation to r/o MRSA. Continue to monitor.

## 2015-03-28 NOTE — Care Management Note (Signed)
Case Management Note  Patient Details  Name: Gregory Luna MRN: [id number] Date of Birth: 03-06-1929  Subjective/Objective:   Request called and faxed to [facility] requesting PT. Discussed discharge planning with wife who chose [facility] as [name] home health provider.                  Action/Plan:   Expected Discharge Date:                  Expected Discharge Plan:     In-House Referral:     Discharge planning Services     Post Acute Care Choice:    Choice offered to:     DME Arranged:    DME Agency:     HH Arranged:    [place] Agency:     Status of Service:     Medicare Important Message Given:    Date Medicare IM Given:    Medicare IM give by:    Date Additional Medicare IM Given:    Additional Medicare Important Message give by:     If discussed at [place] of Stay Meetings, dates discussed:    Additional Comments:  Gregory Sloane A, RN 03/28/2015, 11:27 AM

## 2015-03-29 ENCOUNTER — Inpatient Hospital Stay
Admission: EM | Admit: 2015-03-29 | Discharge: 2015-04-01 | DRG: 689 | Disposition: A | Discharge status: Skilled Nursing Facility | Payer: Medicare Other | Attending: Internal Medicine | Admitting: Internal Medicine

## 2015-03-29 ENCOUNTER — Emergency Department: Payer: Medicare Other

## 2015-03-29 ENCOUNTER — Encounter: Payer: Self-pay | Admitting: Emergency Medicine

## 2015-03-29 DIAGNOSIS — Z9889 Other specified postprocedural states: Secondary | ICD-10-CM

## 2015-03-29 DIAGNOSIS — Z87891 Personal history of nicotine dependence: Secondary | ICD-10-CM | POA: Diagnosis not present

## 2015-03-29 DIAGNOSIS — Z803 Family history of malignant neoplasm of breast: Secondary | ICD-10-CM

## 2015-03-29 DIAGNOSIS — G9340 Encephalopathy, unspecified: Secondary | ICD-10-CM | POA: Diagnosis present

## 2015-03-29 DIAGNOSIS — Z951 Presence of aortocoronary bypass graft: Secondary | ICD-10-CM

## 2015-03-29 DIAGNOSIS — N184 Chronic kidney disease, stage 4 (severe): Secondary | ICD-10-CM | POA: Diagnosis present

## 2015-03-29 DIAGNOSIS — R4182 Altered mental status, unspecified: Secondary | ICD-10-CM

## 2015-03-29 DIAGNOSIS — Z8546 Personal history of malignant neoplasm of prostate: Secondary | ICD-10-CM | POA: Diagnosis not present

## 2015-03-29 DIAGNOSIS — R29810 Facial weakness: Secondary | ICD-10-CM

## 2015-03-29 DIAGNOSIS — B965 Pseudomonas (aeruginosa) (mallei) (pseudomallei) as the cause of diseases classified elsewhere: Secondary | ICD-10-CM | POA: Diagnosis present

## 2015-03-29 DIAGNOSIS — E1122 Type 2 diabetes mellitus with diabetic chronic kidney disease: Secondary | ICD-10-CM | POA: Diagnosis present

## 2015-03-29 DIAGNOSIS — C44320 Squamous cell carcinoma of skin of unspecified parts of face: Secondary | ICD-10-CM | POA: Diagnosis present

## 2015-03-29 DIAGNOSIS — N390 Urinary tract infection, site not specified: Secondary | ICD-10-CM | POA: Diagnosis present

## 2015-03-29 DIAGNOSIS — G9341 Metabolic encephalopathy: Secondary | ICD-10-CM | POA: Diagnosis present

## 2015-03-29 DIAGNOSIS — I129 Hypertensive chronic kidney disease with stage 1 through stage 4 chronic kidney disease, or unspecified chronic kidney disease: Secondary | ICD-10-CM | POA: Diagnosis present

## 2015-03-29 DIAGNOSIS — Z87440 Personal history of urinary (tract) infections: Secondary | ICD-10-CM | POA: Diagnosis not present

## 2015-03-29 DIAGNOSIS — K219 Gastro-esophageal reflux disease without esophagitis: Secondary | ICD-10-CM | POA: Diagnosis present

## 2015-03-29 DIAGNOSIS — G8929 Other chronic pain: Secondary | ICD-10-CM | POA: Diagnosis present

## 2015-03-29 DIAGNOSIS — Z79899 Other long term (current) drug therapy: Secondary | ICD-10-CM | POA: Diagnosis not present

## 2015-03-29 DIAGNOSIS — I2510 Atherosclerotic heart disease of native coronary artery without angina pectoris: Secondary | ICD-10-CM | POA: Diagnosis present

## 2015-03-29 DIAGNOSIS — C679 Malignant neoplasm of bladder, unspecified: Secondary | ICD-10-CM | POA: Diagnosis present

## 2015-03-29 DIAGNOSIS — Z905 Acquired absence of kidney: Secondary | ICD-10-CM | POA: Diagnosis not present

## 2015-03-29 DIAGNOSIS — N179 Acute kidney failure, unspecified: Secondary | ICD-10-CM | POA: Diagnosis present

## 2015-03-29 LAB — COMPREHENSIVE METABOLIC PANEL
ALBUMIN: 2.5 g/dL — AB (ref 3.5–5.0)
ALK PHOS: 182 U/L — AB (ref 38–126)
ALT: 35 U/L (ref 17–63)
ANION GAP: 10 (ref 5–15)
AST: 24 U/L (ref 15–41)
BUN: 41 mg/dL — ABNORMAL HIGH (ref 6–20)
CO2: 22 mmol/L (ref 22–32)
Calcium: 9.4 mg/dL (ref 8.9–10.3)
Chloride: 111 mmol/L (ref 101–111)
Creatinine, Ser: 2.4 mg/dL — ABNORMAL HIGH (ref 0.61–1.24)
GFR calc Af Amer: 27 mL/min — ABNORMAL LOW (ref >60)
GFR calc non Af Amer: 23 mL/min — ABNORMAL LOW (ref >60)
GLUCOSE: 153 mg/dL — AB (ref 65–99)
POTASSIUM: 3.3 mmol/L — AB (ref 3.5–5.1)
SODIUM: 143 mmol/L (ref 135–145)
Total Bilirubin: <0.1 mg/dL — ABNORMAL LOW (ref 0.3–1.2)
Total Protein: 7.3 g/dL (ref 6.5–8.1)

## 2015-03-29 LAB — URINALYSIS COMPLETE WITH MICROSCOPIC (ARMC ONLY)
BILIRUBIN URINE: NEGATIVE
Glucose, UA: 50 mg/dL — AB
KETONES UR: NEGATIVE mg/dL
NITRITE: NEGATIVE
PH: 6 (ref 5.0–8.0)
Protein, ur: 30 mg/dL — AB
Specific Gravity, Urine: 1.012 (ref 1.005–1.030)

## 2015-03-29 LAB — CBC WITH DIFFERENTIAL/PLATELET
BASOS PCT: 1 %
Basophils Absolute: 0 10*3/uL (ref 0–0.1)
EOS ABS: 0.3 10*3/uL (ref 0–0.7)
Eosinophils Relative: 4 %
HCT: 29.4 % — ABNORMAL LOW (ref 40.0–52.0)
HEMOGLOBIN: 9.7 g/dL — AB (ref 13.0–18.0)
Lymphocytes Relative: 22 %
Lymphs Abs: 1.4 10*3/uL (ref 1.0–3.6)
MCH: 29.3 pg (ref 26.0–34.0)
MCHC: 33.1 g/dL (ref 32.0–36.0)
MCV: 88.6 fL (ref 80.0–100.0)
MONOS PCT: 13 %
Monocytes Absolute: 0.8 10*3/uL (ref 0.2–1.0)
NEUTROS PCT: 60 %
Neutro Abs: 4 10*3/uL (ref 1.4–6.5)
Platelets: 244 10*3/uL (ref 150–440)
RBC: 3.31 MIL/uL — ABNORMAL LOW (ref 4.40–5.90)
RDW: 15.6 % — AB (ref 11.5–14.5)
WBC: 6.5 10*3/uL (ref 3.8–10.6)

## 2015-03-29 LAB — TROPONIN I: Troponin I: <0.03 ng/mL (ref <0.031)

## 2015-03-29 MED ORDER — PANTOPRAZOLE SODIUM 40 MG PO TBEC
40.0000 mg | DELAYED_RELEASE_TABLET | Freq: Every day | ORAL | Status: DC
  Administered 2015-03-30 – 2015-04-01 (×3): 40 mg via ORAL
  Filled 2015-03-29 (×3): qty 1

## 2015-03-29 MED ORDER — MORPHINE SULFATE (PF) 2 MG/ML IV SOLN
2.0000 mg | INTRAVENOUS | Status: DC | PRN
  Administered 2015-03-29: 2 mg via INTRAVENOUS
  Filled 2015-03-29: qty 1

## 2015-03-29 MED ORDER — ACETAMINOPHEN 650 MG RE SUPP: 650.0000 mg | Freq: Four times a day (QID) | RECTAL | Status: DC | PRN

## 2015-03-29 MED ORDER — METHENAMINE HIPPURATE 1 G PO TABS
1.0000 g | ORAL_TABLET | Freq: Four times a day (QID) | ORAL | Status: DC
  Filled 2015-03-29 (×8): qty 1

## 2015-03-29 MED ORDER — FINASTERIDE 5 MG PO TABS
5.0000 mg | ORAL_TABLET | Freq: Every day | ORAL | Status: DC
  Administered 2015-03-30 – 2015-04-01 (×3): 5 mg via ORAL
  Filled 2015-03-29 (×3): qty 1

## 2015-03-29 MED ORDER — HYDROMORPHONE HCL 2 MG PO TABS
4.0000 mg | ORAL_TABLET | ORAL | Status: DC | PRN
  Administered 2015-03-30: 4 mg via ORAL
  Filled 2015-03-29: qty 2

## 2015-03-29 MED ORDER — LUBRIFRESH P.M. OP OINT
TOPICAL_OINTMENT | Freq: Every evening | OPHTHALMIC | Status: DC | PRN
Start: 2015-03-29 — End: 2015-04-01
  Filled 2015-03-29: qty 3.5

## 2015-03-29 MED ORDER — ARTIFICIAL TEARS OP OINT
TOPICAL_OINTMENT | Freq: Every evening | OPHTHALMIC | Status: DC | PRN
  Filled 2015-03-29: qty 3.5

## 2015-03-29 MED ORDER — NYSTATIN 100000 UNIT/ML MT SUSP
5.0000 mL | Freq: Two times a day (BID) | OROMUCOSAL | Status: DC
  Administered 2015-03-30 – 2015-04-01 (×4): 500000 [IU] via ORAL
  Filled 2015-03-29 (×5): qty 5

## 2015-03-29 MED ORDER — SODIUM CHLORIDE 0.9 % IV SOLN
INTRAVENOUS | Status: DC
  Administered 2015-03-29 – 2015-04-01 (×3): via INTRAVENOUS

## 2015-03-29 MED ORDER — CHLORHEXIDINE GLUCONATE 0.12 % MT SOLN
15.0000 mL | Freq: Two times a day (BID) | OROMUCOSAL | Status: DC
  Administered 2015-03-30 – 2015-03-31 (×2): 15 mL via OROMUCOSAL
  Filled 2015-03-29 (×3): qty 15

## 2015-03-29 MED ORDER — MORPHINE SULFATE ER 15 MG PO TBCR
60.0000 mg | EXTENDED_RELEASE_TABLET | Freq: Three times a day (TID) | ORAL | Status: DC
  Administered 2015-03-30 – 2015-04-01 (×7): 60 mg via ORAL
  Filled 2015-03-29 (×8): qty 4

## 2015-03-29 MED ORDER — ONDANSETRON HCL 4 MG PO TABS: 4.0000 mg | ORAL_TABLET | Freq: Four times a day (QID) | ORAL | Status: DC | PRN

## 2015-03-29 MED ORDER — PIPERACILLIN-TAZOBACTAM 3.375 G IVPB
3.3750 g | Freq: Once | INTRAVENOUS | Status: AC
  Administered 2015-03-29: 3.375 g via INTRAVENOUS
  Filled 2015-03-29: qty 50

## 2015-03-29 MED ORDER — POLYVINYL ALCOHOL 1.4 % OP SOLN
1.0000 [drp] | OPHTHALMIC | Status: DC | PRN
  Filled 2015-03-29: qty 15

## 2015-03-29 MED ORDER — ACETAMINOPHEN 325 MG PO TABS: 650.0000 mg | ORAL_TABLET | Freq: Four times a day (QID) | ORAL | Status: DC | PRN

## 2015-03-29 MED ORDER — HYPROMELLOSE 0.3 % OP GEL
1.0000 "application " | Freq: Every day | OPHTHALMIC | Status: DC
  Filled 2015-03-29: qty 3.5

## 2015-03-29 MED ORDER — OXYCODONE HCL 5 MG PO TABS
5.0000 mg | ORAL_TABLET | ORAL | Status: DC | PRN
  Administered 2015-03-31: 5 mg via ORAL
  Filled 2015-03-29: qty 1

## 2015-03-29 MED ORDER — FENTANYL CITRATE (PF) 100 MCG/2ML IJ SOLN
25.0000 ug | Freq: Once | INTRAMUSCULAR | Status: AC
  Administered 2015-03-29: 25 ug via INTRAVENOUS
  Filled 2015-03-29: qty 2

## 2015-03-29 MED ORDER — DEXTROMETHORPHAN-GUAIFENESIN 10-100 MG/5ML PO LIQD
5.0000 mL | ORAL | Status: DC | PRN
  Filled 2015-03-29: qty 5

## 2015-03-29 MED ORDER — HEPARIN SODIUM (PORCINE) 5000 UNIT/ML IJ SOLN
5000.0000 [IU] | Freq: Three times a day (TID) | INTRAMUSCULAR | Status: DC
  Administered 2015-03-29 – 2015-03-30 (×3): 5000 [IU] via SUBCUTANEOUS
  Filled 2015-03-29 (×3): qty 1

## 2015-03-29 MED ORDER — PIPERACILLIN-TAZOBACTAM 4.5 G IVPB
4.5000 g | Freq: Three times a day (TID) | INTRAVENOUS | Status: DC
  Administered 2015-03-30: 4.5 g via INTRAVENOUS
  Filled 2015-03-29 (×3): qty 100

## 2015-03-29 MED ORDER — POLYETHYL GLYCOL-PROPYL GLYCOL 0.4-0.3 % OP GEL
OPHTHALMIC | Status: DC | PRN
  Filled 2015-03-29: qty 10

## 2015-03-29 MED ORDER — ONDANSETRON HCL 4 MG/2ML IJ SOLN: 4.0000 mg | Freq: Four times a day (QID) | INTRAMUSCULAR | Status: DC | PRN

## 2015-03-29 MED ORDER — PIPERACILLIN-TAZOBACTAM 3.375 G IVPB
INTRAVENOUS | Status: AC
  Filled 2015-03-29: qty 50

## 2015-03-29 NOTE — ED Notes (Signed)
Pt d/c yesterday, daughter reports confusion and lethargy since 1230 today. Reports pt left sided facial droop is from cancer.

## 2015-03-29 NOTE — H&P (Signed)
[facility] NAME: Gregory Luna    MR#:  [id number]  DATE OF BIRTH:  May 11, 1929   DATE OF ADMISSION:  03/29/2015  PRIMARY CARE PHYSICIAN: Rusty Aus., MD   REQUESTING/REFERRING PHYSICIAN: Schaevitz  CHIEF COMPLAINT:   Chief Complaint  Patient presents with  . Altered Mental Status    HISTORY OF PRESENT ILLNESS:  Gregory Luna  is a 81 y.o. male with a known history of squamous cell carcinoma of the face, chronic kidney disease baseline creatinine around 2 who is presenting with altered mental status. The patient is unable to provide meaningful information given mental status/medical condition history obtained from family hours present at bedside. Of note recently discharged from [facility] with discharge diagnosis UTI placed on amoxicillin for antibiotic coverage after being transitioned from IV Zosyn. The patient had improvement before and upon discharge however now one day duration of altered mental status described mainly as lethargy/somnolent. No further symptomatology  PAST MEDICAL HISTORY:   Past Medical History  Diagnosis Date  . Coronary artery disease   . Hypertension   . Peripheral vascular disease ([place])   . Chronic kidney disease   . Diabetes mellitus without complication ([place])   . Cancer ([place])     left face squamous cell, prostate, bladder    PAST SURGICAL HISTORY:   Past Surgical History  Procedure Laterality Date  . Coronary artery bypass graft    . Eye surgery    . Hernia repair    . Nephrectomy Left     SOCIAL HISTORY:   Social History  Substance Use Topics  . Smoking status: Former Smoker    Types: Cigarettes  . Smokeless tobacco: Never Used  . Alcohol Use: No    FAMILY HISTORY:   Family History  Problem Relation Age of Onset  . Breast cancer Sister     DRUG ALLERGIES:  No Known Allergies  REVIEW OF SYSTEMS:  Unable to obtain given  patient's mental status/medical condition   MEDICATIONS AT HOME:   Prior to Admission medications   Medication Sig Start Date End Date Taking? Authorizing Provider  amoxicillin-clavulanate (AUGMENTIN) 875-125 MG tablet Take 1 tablet by mouth 2 (two) times daily. 03/28/15  Yes Dustin Flock, MD  chlorhexidine (PERIDEX) 0.12 % solution Use as directed 15 mLs in the mouth or throat 2 (two) times daily.   Yes Historical Provider, MD  dextromethorphan-guaiFENesin (TUSSIN DM) 10-100 MG/5ML liquid Take 5 mLs by mouth every 4 (four) hours as needed for cough.    Yes Historical Provider, MD  finasteride (PROSCAR) 5 MG tablet Take 5 mg by mouth daily.   Yes Historical Provider, MD  HYDROmorphone (DILAUDID) 4 MG tablet Take 4-8 mg by mouth every 3 (three) hours as needed for severe pain.   Yes Historical Provider, MD  hypromellose (SYSTANE OVERNIGHT THERAPY) 0.3 % GEL ophthalmic ointment Place 1 application into both eyes at bedtime.   Yes Historical Provider, MD  methenamine (HIPREX) 1 G tablet Take 1 g by mouth 4 (four) times daily.   Yes Historical Provider, MD  nystatin (MYCOSTATIN) 100000 UNIT/ML suspension Take 5 mLs by mouth 2 (two) times daily.   Yes Historical Provider, MD  oxymorphone (OPANA ER) 30 MG 12 hr tablet Take 60 mg by mouth 3 (three) times daily.   Yes Historical Provider, MD  pantoprazole (PROTONIX) 40 MG tablet Take 40 mg by mouth daily.   Yes Historical Provider, MD  Polyethyl Glycol-Propyl  Glycol (SYSTANE OP) Place 1 drop into both eyes as needed (for dry eyes).    Yes Historical Provider, MD      VITAL SIGNS:  Blood pressure 122/61, pulse 65, temperature 98.1 F (36.7 C), temperature source Oral, resp. rate 18, height 6' (1.829 m), weight 163 lb (73.936 kg), SpO2 98 %.  PHYSICAL EXAMINATION:   VITAL SIGNS: Filed Vitals:   03/29/15 2100  BP: 122/61  Pulse: 65  Temp:   Resp: 18   GENERAL:81 y.o.male moderate distress given mental status.  HEAD: Normocephalic,  atraumatic. Left-sided face with drooping/bandage dressing secondary to cancer EYES: Pupils equal, round, reactive to light. Unable to assess extraocular muscles given mental status/medical condition. No scleral icterus.  MOUTH: Markedly dry mucosal membrane. Dentition intact. No abscess noted.  EAR, NOSE, THROAT: Clear without exudates. No external lesions.  NECK: Supple. No thyromegaly. No nodules. No JVD.  PULMONARY: Clear to ascultation, without wheeze rails or rhonci. No use of accessory muscles, Good respiratory effort. good air entry bilaterally CHEST: Nontender to palpation.  CARDIOVASCULAR: S1 and S2. Regular rate and rhythm. No murmurs, rubs, or gallops. No edema. Pedal pulses 2+ bilaterally.  GASTROINTESTINAL: Soft, nontender, nondistended. No masses. Positive bowel sounds. No hepatosplenomegaly.  MUSCULOSKELETAL: No swelling, clubbing, or edema. Range of motion full in all extremities.  NEUROLOGIC: Unable to assess given mental status/medical condition SKIN: No ulceration, lesions, rashes, or cyanosis. Skin warm and dry. Turgor intact.  PSYCHIATRIC: Unable to assess given mental status/medical condition      LABORATORY PANEL:   CBC  Recent Labs Lab 03/29/15 1727  WBC 6.5  HGB 9.7*  HCT 29.4*  PLT 244   ------------------------------------------------------------------------------------------------------------------  Chemistries   Recent Labs Lab 03/29/15 1727  NA 143  K 3.3*  CL 111  CO2 22  GLUCOSE 153*  BUN 41*  CREATININE 2.40*  CALCIUM 9.4  AST 24  ALT 35  ALKPHOS 182*  BILITOT <0.1*   ------------------------------------------------------------------------------------------------------------------  Cardiac Enzymes  Recent Labs Lab 03/29/15 1727  TROPONINI <0.03   ------------------------------------------------------------------------------------------------------------------  RADIOLOGY:  Dg Chest 2 View  03/29/2015  CLINICAL DATA:   Recent UTI, confusion, lethargy EXAM: CHEST  2 VIEW COMPARISON:  03/26/2015 FINDINGS: Lower lung volumes with increased bibasilar atelectasis, worse on the left. Prior coronary bypass. Stable heart size and vascularity. No superimposed edema or effusion. No pneumothorax. Trachea midline. Degenerative changes of the spine. Bones are osteopenic. IMPRESSION: Lower volume exam with increased atelectasis. Electronically Signed   By: Jerilynn Mages.  Shick M.D.   On: 03/29/2015 18:24    EKG:   Orders placed or performed during the hospital encounter of 03/26/15  . EKG 12-Lead  . EKG 12-Lead  . EKG 12-Lead  . EKG 12-Lead  . EKG    IMPRESSION AND PLAN:   81 year old gentleman history of squamous cell carcinoma of the face as well as type 2 diabetes non-insulin-requiring presenting with altered mental status.  1. Acute encephalopathy: Secondary to Pseudomonas urinary tract infection, failed outpatient treatment started on IV Zosyn and emergency department continue this, monitor for improvement avoid further sedating agents. Follow culture data for finalization 2. Acute kidney injury on chronic kidney disease: IV fluid hydration and follow urine output renal function 3. GERD without esophagitis: PPI therapy 4. Squamous cell carcinoma of the face with chronic pain: Continue home pain medication 5. Venous thromboembolism prophylactic: Heparin subcutaneous    All the records are reviewed and case discussed with ED provider. Management plans discussed with the patient, family and they are  in agreement.  CODE STATUS: Full  TOTAL TIME TAKING CARE OF THIS PATIENT: 35 minutes.    Hower,  Karenann Cai.D on 03/29/2015 at 9:27 PM  Between 7am to 6pm - Pager - [phone]  After 6pm: House Pager: - [facility]  Office  [phone]  CC: Primary care physician; Rusty Aus., MD

## 2015-03-29 NOTE — Progress Notes (Signed)
ANTIBIOTIC CONSULT NOTE - INITIAL  Pharmacy Consult for Zosyn Indication: Pseudomonas UTI  No Known Allergies  Patient Measurements: Height: 6' (182.9 cm) Weight: 163 lb (73.936 kg) IBW/kg (Calculated) : 77.6 Adjusted Body Weight:   Vital Signs: Temp: 98.3 F (36.8 C) (11/07 2202) Temp Source: Oral (11/07 2202) BP: 141/65 mmHg (11/07 2202) Pulse Rate: 72 (11/07 2202) Intake/Output from previous day:   Intake/Output from this shift:    Labs:  Recent Labs  03/27/15 0434 03/28/15 0333 03/29/15 1727  WBC 8.8  --  6.5  HGB 8.3*  --  9.7*  PLT 200  --  244  CREATININE 2.17* 2.31* 2.40*   Estimated Creatinine Clearance: 23.9 mL/min (by C-G formula based on Cr of 2.4). No results for input(s): VANCOTROUGH, VANCOPEAK, VANCORANDOM, GENTTROUGH, GENTPEAK, GENTRANDOM, TOBRATROUGH, TOBRAPEAK, TOBRARND, AMIKACINPEAK, AMIKACINTROU, AMIKACIN in the last 72 hours.   Microbiology: Recent Results (from the past 720 hour(s))  Culture, blood (routine x 2)     Status: None (Preliminary result)   Collection Time: 03/26/15  1:20 PM  Result Value Ref Range Status   Specimen Description BLOOD RIGHT AC  Final   Special Requests BOTTLES DRAWN AEROBIC AND ANAEROBIC  4CC  Final   Culture NO GROWTH 3 DAYS  Final   Report Status PENDING  Incomplete  Urine culture     Status: None (Preliminary result)   Collection Time: 03/26/15  2:58 PM  Result Value Ref Range Status   Specimen Description URINE, RANDOM  Final   Special Requests NONE  Final   Culture   Final    >=100,000 COLONIES/mL PSEUDOMONAS AERUGINOSA SUSCEPTIBILITIES TO FOLLOW REPEATING SENSITIVITIES    Report Status PENDING  Incomplete  Culture, blood (routine x 2)     Status: None (Preliminary result)   Collection Time: 03/26/15  3:25 PM  Result Value Ref Range Status   Specimen Description BLOOD LEFT FOREARM  Final   Special Requests BOTTLES DRAWN AEROBIC AND ANAEROBIC 1CC  Final   Culture  Setup Time   Final    GRAM POSITIVE  COCCI ANAEROBIC BOTTLE ONLY CRITICAL RESULT CALLED TO, READ BACK BY AND VERIFIED WITH: [place] LOYE @ 1224 on 03/29/2015 BY CAF CONFIRMED BY TFK AND TB    Culture   Final    GRAM POSITIVE COCCI ANAEROBIC BOTTLE ONLY IDENTIFICATION TO FOLLOW    Report Status PENDING  Incomplete    Medical History: Past Medical History  Diagnosis Date  . Coronary artery disease   . Hypertension   . Peripheral vascular disease ([place])   . Chronic kidney disease   . Diabetes mellitus without complication ([place])   . Cancer ([place])     left face squamous cell, prostate, bladder    Medications:  Prescriptions prior to admission  Medication Sig Dispense Refill Last Dose  . amoxicillin-clavulanate (AUGMENTIN) 875-125 MG tablet Take 1 tablet by mouth 2 (two) times daily. 14 tablet 0 03/29/2015 at Unknown time  . chlorhexidine (PERIDEX) 0.12 % solution Use as directed 15 mLs in the mouth or throat 2 (two) times daily.   03/28/2015 at Unknown time  . dextromethorphan-guaiFENesin (TUSSIN DM) 10-100 MG/5ML liquid Take 5 mLs by mouth every 4 (four) hours as needed for cough.    Past Week at Unknown time  . finasteride (PROSCAR) 5 MG tablet Take 5 mg by mouth daily.   03/29/2015 at Unknown time  . HYDROmorphone (DILAUDID) 4 MG tablet Take 4-8 mg by mouth every 3 (three) hours as needed for severe pain.  Past Week at Unknown time  . hypromellose (SYSTANE OVERNIGHT THERAPY) 0.3 % GEL ophthalmic ointment Place 1 application into both eyes at bedtime.   03/28/2015 at Unknown time  . methenamine (HIPREX) 1 G tablet Take 1 g by mouth 4 (four) times daily.   03/29/2015 at Unknown time  . nystatin (MYCOSTATIN) 100000 UNIT/ML suspension Take 5 mLs by mouth 2 (two) times daily.   03/28/2015 at Unknown time  . oxymorphone (OPANA ER) 30 MG 12 hr tablet Take 60 mg by mouth 3 (three) times daily.   03/29/2015 at 0645  . pantoprazole (PROTONIX) 40 MG tablet Take 40 mg by mouth daily.   03/29/2015 at Unknown time  . Polyethyl Glycol-Propyl  Glycol (SYSTANE OP) Place 1 drop into both eyes as needed (for dry eyes).    03/29/2015 at Unknown time   Assessment: CrCl = 23.9 ml/min Prior use of augmentin at home, was recently admitted and ordered Zosyn.   Goal of Therapy:  Resolution of infection  Plan:  Expected duration 7 days with resolution of temperature and/or normalization of WBC  Zosyn 3.375 gm IV X 1 given on 11/7 @ 22:00.  Will start Zosyn 4.5 gm IV Q8H EI on 11/8 @ 6:00.   Jshon Ibe D 03/29/2015,10:26 PM

## 2015-03-29 NOTE — ED Notes (Addendum)
First bag of zosyn tubing had leak, medicine lost. Second bag of Zosyn was removed from pyxis to hang.

## 2015-03-29 NOTE — ED Notes (Signed)
Patient transported to X-ray 

## 2015-03-29 NOTE — ED Provider Notes (Signed)
[facility] Emergency Department Provider Note  ____________________________________________  Time seen: Approximately 921 PM  I have reviewed the triage vital signs and the nursing notes.   HISTORY  Chief Complaint Altered Mental Status    HPI Gregory Luna is a 81 y.o. male with a history of coronary artery disease and squamous cell skin cancer who is presenting today with difficulty to arouse. His family says that he was just here with a urinary tract infection and was released yesterday from the hospital. He was released with Augmentin. He has taken one dose at home. He denies any pain at this time. He denies any focal weakness or numbness. The family says that he has not been confused today but just has been difficult to wake up. At this time in the emergency department they say that he has as awake as he has been all day.   Past Medical History  Diagnosis Date  . Coronary artery disease   . Hypertension   . Peripheral vascular disease ([place])   . Chronic kidney disease   . Diabetes mellitus without complication ([place])   . Cancer [facility])     left face squamous cell, prostate, bladder    Patient Active Problem List   Diagnosis Date Noted  . Malnutrition of moderate degree 03/28/2015  . Sepsis ([place]) 03/26/2015    Past Surgical History  Procedure Laterality Date  . Coronary artery bypass graft    . Eye surgery    . Hernia repair    . Nephrectomy Left     Current Outpatient Rx  Name  Route  Sig  Dispense  Refill  . amoxicillin-clavulanate (AUGMENTIN) 875-125 MG tablet   Oral   Take 1 tablet by mouth 2 (two) times daily.   14 tablet   0   . chlorhexidine (PERIDEX) 0.12 % solution   Mouth/Throat   Use as directed 15 mLs in the mouth or throat 2 (two) times daily.         Marland Kitchen dextromethorphan-guaiFENesin (TUSSIN DM) 10-100 MG/5ML liquid   Oral   Take 5 mLs by mouth every 4 (four) hours as needed for cough.          . finasteride  (PROSCAR) 5 MG tablet   Oral   Take 5 mg by mouth daily.         Marland Kitchen HYDROmorphone (DILAUDID) 4 MG tablet   Oral   Take 4-8 mg by mouth every 3 (three) hours as needed for severe pain.         . hypromellose (SYSTANE OVERNIGHT THERAPY) 0.3 % GEL ophthalmic ointment   Both Eyes   Place 1 application into both eyes at bedtime.         . methenamine (HIPREX) 1 G tablet   Oral   Take 1 g by mouth 4 (four) times daily.         Marland Kitchen nystatin (MYCOSTATIN) 100000 UNIT/ML suspension   Oral   Take 5 mLs by mouth 2 (two) times daily.         Marland Kitchen oxymorphone (OPANA ER) 30 MG 12 hr tablet   Oral   Take 60 mg by mouth 3 (three) times daily.         . pantoprazole (PROTONIX) 40 MG tablet   Oral   Take 40 mg by mouth daily.         Vladimir Faster Glycol-Propyl Glycol (SYSTANE OP)   Both Eyes   Place 1 drop into both eyes as  needed (for dry eyes).            Allergies Review of patient's allergies indicates no known allergies.  Family History  Problem Relation Age of Onset  . Breast cancer Sister     Social History Social History  Substance Use Topics  . Smoking status: Former Smoker    Types: Cigarettes  . Smokeless tobacco: Never Used  . Alcohol Use: No    Review of Systems Constitutional: No fever/chills Eyes: No visual changes. ENT: No sore throat. Cardiovascular: Denies chest pain. Respiratory: Denies shortness of breath. Gastrointestinal: No abdominal pain.  No nausea, no vomiting.  No diarrhea.  No constipation. Genitourinary: Negative for dysuria. Musculoskeletal: Negative for back pain. Skin: Negative for rash. Neurological: Negative for headaches, focal weakness or numbness.  10-point ROS otherwise negative.  ____________________________________________   PHYSICAL EXAM:  VITAL SIGNS: ED Triage Vitals  Enc Vitals Group     BP 03/29/15 1716 108/51 mmHg     Pulse Rate 03/29/15 1714 75     Resp 03/29/15 1714 16     Temp 03/29/15 1714 98.1 F  (36.7 C)     Temp Source 03/29/15 1714 Oral     SpO2 03/29/15 1714 100 %     Weight 03/29/15 1714 163 lb (73.936 kg)     Height 03/29/15 1714 6' (1.829 m)     Head Cir --      Peak Flow --      Pain Score 03/29/15 1734 9     Pain Loc --      Pain Edu? --      Excl. in [place]? --     Constitutional: Alert and oriented. Well appearing and in no acute distress. Eyes: Conjunctivae are normal. PERRL. EOMI. Head: Atraumatic. Nose: No congestion/rhinnorhea. Mouth/Throat: Mucous membranes are moist.  Oropharynx non-erythematous. Neck: No stridor.   Cardiovascular: Normal rate, regular rhythm. Grossly normal heart sounds.  Good peripheral circulation. Respiratory: Normal respiratory effort.  No retractions. Lungs CTAB. Gastrointestinal: Soft and nontender. No distention. No abdominal bruits. No CVA tenderness. Musculoskeletal: No lower extremity tenderness nor edema.  No joint effusions. Neurologic:  Garbled speech with a left-sided facial droop that is chronic from his skin cancer which is to the left side of his face. He has 5 out of 5 strength throughout. Sensation is intact to light touch. Skin:  Skin is warm, dry.  Resection sites to the left side of the face just anterior to the left ear. Psychiatric: Mood and affect are normal. Speech and behavior are normal.  ____________________________________________   LABS (all labs ordered are listed, but only abnormal results are displayed)  Labs Reviewed  CBC WITH DIFFERENTIAL/PLATELET - Abnormal; Notable for the following:    RBC 3.31 (*)    Hemoglobin 9.7 (*)    HCT 29.4 (*)    RDW 15.6 (*)    All other components within normal limits  COMPREHENSIVE METABOLIC PANEL - Abnormal; Notable for the following:    Potassium 3.3 (*)    Glucose, Bld 153 (*)    BUN 41 (*)    Creatinine, Ser 2.40 (*)    Albumin 2.5 (*)    Alkaline Phosphatase 182 (*)    Total Bilirubin <0.1 (*)    GFR calc non Af Amer 23 (*)    GFR calc Af Amer 27 (*)    All  other components within normal limits  URINALYSIS COMPLETEWITH MICROSCOPIC ([facility] ONLY) - Abnormal; Notable for the following:    Color,  Urine YELLOW (*)    APPearance CLOUDY (*)    Glucose, UA 50 (*)    Hgb urine dipstick 1+ (*)    Protein, ur 30 (*)    Leukocytes, UA 3+ (*)    Bacteria, UA RARE (*)    Squamous Epithelial / LPF 0-5 (*)    All other components within normal limits  URINE CULTURE  TROPONIN I   ____________________________________________  EKG  ED ECG REPORT I, Doran Stabler, the attending physician, personally viewed and interpreted this ECG.   Date: 03/29/2015  EKG Time: 1726  Rate: 76  Rhythm: normal sinus rhythm  Axis: Normal axis  Intervals: Incomplete right bundle branch block.  ST&T Change: No ST segment elevation or depression. T-wave inversion in V2, V3.  Significant change from 03/26/2015. ____________________________________________  RADIOLOGY  Low volume chest x-ray with increased atelectasis ____________________________________________   PROCEDURES  ____________________________________________   INITIAL IMPRESSION / ASSESSMENT AND PLAN / ED COURSE  Pertinent labs & imaging results that were available during my care of the patient were reviewed by me and considered in my medical decision making (see chart for details).  ----------------------------------------- 8:26 PM on 03/29/2015 -----------------------------------------  Patient still with decreased activity. Difficult to arouse at this time. The family also says that he was out walking earlier today and his walker as he normally does. The patient's urine is now growing out pseudomonas. He was sent home on Augmentin which would not cover this. The family says that he was improving over the weekend on Zosyn. I will restart Zosyn. He will need to be readmitted to the hospital for his altered mental status. The need for admission to the family. Signed out to Dr.  Lavetta Nielsen. ____________________________________________   FINAL CLINICAL IMPRESSION(S) / ED DIAGNOSES  UTI. Failure of outpatient antibiotics. Altered mental status.    Orbie Pyo, MD 03/29/15 2027

## 2015-03-29 NOTE — ED Notes (Signed)
CRITICAL VALUE ALERT  Critical value received:  Positive blood cx  Date of notification:  03/29/15  Time of notification:  5993  Critical value read back:Yes.    Nurse who received alert:  [place] rn  MD notified (1st page):  schaevitz  Time of first page: 1850 MD notified (2nd page):  Time of second page:  Responding MD:  schaevutz  Time MD responded:  1850

## 2015-03-30 LAB — BASIC METABOLIC PANEL
Anion gap: 2 — ABNORMAL LOW (ref 5–15)
BUN: 40 mg/dL — ABNORMAL HIGH (ref 6–20)
CHLORIDE: 118 mmol/L — AB (ref 101–111)
CO2: 22 mmol/L (ref 22–32)
CREATININE: 2.37 mg/dL — AB (ref 0.61–1.24)
Calcium: 8.7 mg/dL — ABNORMAL LOW (ref 8.9–10.3)
GFR calc non Af Amer: 24 mL/min — ABNORMAL LOW (ref >60)
GFR, EST AFRICAN AMERICAN: 27 mL/min — AB (ref >60)
Glucose, Bld: 114 mg/dL — ABNORMAL HIGH (ref 65–99)
Potassium: 3.3 mmol/L — ABNORMAL LOW (ref 3.5–5.1)
Sodium: 142 mmol/L (ref 135–145)

## 2015-03-30 LAB — URINE CULTURE: Culture: >=100000

## 2015-03-30 MED ORDER — PIPERACILLIN-TAZOBACTAM 3.375 G IVPB
3.3750 g | Freq: Three times a day (TID) | INTRAVENOUS | Status: DC
  Filled 2015-03-30 (×2): qty 50

## 2015-03-30 MED ORDER — ENOXAPARIN SODIUM 30 MG/0.3ML ~~LOC~~ SOLN
30.0000 mg | SUBCUTANEOUS | Status: DC
  Administered 2015-03-30 – 2015-03-31 (×2): 30 mg via SUBCUTANEOUS
  Filled 2015-03-30 (×2): qty 0.3

## 2015-03-30 MED ORDER — LEVOFLOXACIN 250 MG PO TABS
250.0000 mg | ORAL_TABLET | ORAL | Status: DC
  Administered 2015-03-30 – 2015-03-31 (×2): 250 mg via ORAL
  Filled 2015-03-30 (×3): qty 1

## 2015-03-30 MED ORDER — LEVOFLOXACIN 500 MG PO TABS
500.0000 mg | ORAL_TABLET | Freq: Once | ORAL | Status: AC
Start: 2015-03-30 — End: 2015-03-30
  Administered 2015-03-30: 500 mg via ORAL
  Filled 2015-03-30: qty 1

## 2015-03-30 NOTE — Progress Notes (Signed)
[facility] [place] NAME: Gregory Luna    MR#:  [id number]  DATE OF BIRTH:  [birth year]  SUBJECTIVE:  CHIEF COMPLAINT:   Chief Complaint  Patient presents with  . Altered Mental Status   Alert. Eating breakfast.  REVIEW OF SYSTEMS:   Review of Systems  Constitutional: Negative for fever.  Respiratory: Negative for shortness of breath.   Cardiovascular: Negative for chest pain and palpitations.  Gastrointestinal: Negative for nausea, vomiting and abdominal pain.  Genitourinary: Negative for dysuria.    DRUG ALLERGIES:  No Known Allergies  VITALS:  Blood pressure 125/60, pulse 71, temperature 98.6 F (37 C), temperature source Oral, resp. rate 17, height 6' (1.829 m), weight 73.936 kg (163 lb), SpO2 100 %.  PHYSICAL EXAMINATION:  GENERAL: 81 y.o.-year-old patient lying in the bed with no acute distress.  EYES: Right pupil is round and reactive, left eye cloudy, redness of the eyelid eyelid, conjunctival inflammation HEENT: Deep wounds over the left temporal and periauricular area with no surrounding erythema, some beige drainage without odor, there is a protuberance over the left mandible which is slightly tender, mucous membranes are dry, tongue is black tarry, posterior oropharynx is clear NECK: Supple, no jugular venous distention. No thyroid enlargement, no tenderness.  LUNGS: Short shallow respirations, fine diffuse crackles, no wheezes or rhonchi, no respiratory distress CARDIOVASCULAR: S1, S2 normal. No murmurs, rubs, or gallops. Tachycardic ABDOMEN: Soft, nontender, nondistended. Bowel sounds present. No organomegaly or mass. No guarding no rebound EXTREMITIES: +1 pedal edema left ankle, none on the right, no cyanosis, or clubbing.  NEUROLOGIC: Cranial nerves II through XII are grossly intact. Muscle strength 5/5 in all extremities. Sensation intact. Gait not checked.  PSYCHIATRIC: The patient is alert and  oriented to person SKIN: As mentioned above deep skin wounds on the left face, senile purpura over both forearms  LABORATORY PANEL:   CBC  Recent Labs Lab 03/29/15 1727  WBC 6.5  HGB 9.7*  HCT 29.4*  PLT 244   ------------------------------------------------------------------------------------------------------------------  Chemistries   Recent Labs Lab 03/29/15 1727 03/30/15 0538  NA 143 142  K 3.3* 3.3*  CL 111 118*  CO2 22 22  GLUCOSE 153* 114*  BUN 41* 40*  CREATININE 2.40* 2.37*  CALCIUM 9.4 8.7*  AST 24  --   ALT 35  --   ALKPHOS 182*  --   BILITOT <0.1*  --    ------------------------------------------------------------------------------------------------------------------  Cardiac Enzymes  Recent Labs Lab 03/29/15 1727  TROPONINI <0.03   ------------------------------------------------------------------------------------------------------------------  RADIOLOGY:  Dg Chest 2 View  03/29/2015  CLINICAL DATA:  Recent UTI, confusion, lethargy EXAM: CHEST  2 VIEW COMPARISON:  03/26/2015 FINDINGS: Lower lung volumes with increased bibasilar atelectasis, worse on the left. Prior coronary bypass. Stable heart size and vascularity. No superimposed edema or effusion. No pneumothorax. Trachea midline. Degenerative changes of the spine. Bones are osteopenic. IMPRESSION: Lower volume exam with increased atelectasis. Electronically Signed   By: Jerilynn Mages.  Shick M.D.   On: 03/29/2015 18:24    EKG:   Orders placed or performed during the hospital encounter of 03/26/15  . EKG 12-Lead  . EKG 12-Lead  . EKG 12-Lead  . EKG 12-Lead  . 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 Management plans discussed with the patient, family and they are in agreement.  CODE STATUS: Full  TOTAL TIME TAKING CARE OF THIS PATIENT: 25 minutes.  Greater than 50% of time spent in care coordination and counseling. POSSIBLE D/C IN 1-2 DAYS, DEPENDING ON CLINICAL CONDITION.   Myrtis Ser M.D on 03/30/2015 at 4:42 PM  Between 7am to 6pm - Pager - [phone]  After 6pm go to [email or web address] - password [email or web address] [facility]  [facility]  Office  [phone]  CC: Primary care physician; Rusty Aus., MD

## 2015-03-30 NOTE — Progress Notes (Signed)
ANTIBIOTIC CONSULT NOTE - INITIAL  Pharmacy Consult for Zosyn Indication: Pseudomonas UTI  No Known Allergies  Patient Measurements: Height: 6' (182.9 cm) Weight: 163 lb (73.936 kg) IBW/kg (Calculated) : 77.6  Vital Signs: Temp: 98.2 F (36.8 C) (11/08 0546) Temp Source: Oral (11/08 0546) BP: 125/49 mmHg (11/08 0546) Pulse Rate: 70 (11/08 0546) Intake/Output from previous day: 11/07 0701 - 11/08 0700 In: 606.3 [I.V.:606.3] Out: -   Labs:  Recent Labs  03/28/15 0333 03/29/15 1727 03/30/15 0538  WBC  --  6.5  --   HGB  --  9.7*  --   PLT  --  244  --   CREATININE 2.31* 2.40* 2.37*   Estimated Creatinine Clearance: 24.3 mL/min (by C-G formula based on Cr of 2.37).    Microbiology: Recent Results (from the past 720 hour(s))  Culture, blood (routine x 2)     Status: None (Preliminary result)   Collection Time: 03/26/15  1:20 PM  Result Value Ref Range Status   Specimen Description BLOOD RIGHT AC  Final   Special Requests BOTTLES DRAWN AEROBIC AND ANAEROBIC  4CC  Final   Culture NO GROWTH 3 DAYS  Final   Report Status PENDING  Incomplete  Urine culture     Status: None (Preliminary result)   Collection Time: 03/26/15  2:58 PM  Result Value Ref Range Status   Specimen Description URINE, RANDOM  Final   Special Requests NONE  Final   Culture   Final    >=100,000 COLONIES/mL PSEUDOMONAS AERUGINOSA SUSCEPTIBILITIES TO FOLLOW REPEATING SENSITIVITIES    Report Status PENDING  Incomplete  Culture, blood (routine x 2)     Status: None (Preliminary result)   Collection Time: 03/26/15  3:25 PM  Result Value Ref Range Status   Specimen Description BLOOD LEFT FOREARM  Final   Special Requests BOTTLES DRAWN AEROBIC AND ANAEROBIC 1CC  Final   Culture  Setup Time   Final    GRAM POSITIVE COCCI ANAEROBIC BOTTLE ONLY CRITICAL RESULT CALLED TO, READ BACK BY AND VERIFIED WITH: [place] LOYE @ 1610 on 03/29/2015 BY CAF CONFIRMED BY TFK AND TB    Culture   Final    COAGULASE  NEGATIVE STAPHYLOCOCCUS ANAEROBIC BOTTLE ONLY Results consistent with contamination.    Report Status PENDING  Incomplete    Medical History: Past Medical History  Diagnosis Date  . Coronary artery disease   . Hypertension   . Peripheral vascular disease ([place])   . Chronic kidney disease   . Diabetes mellitus without complication ([place])   . Cancer ([place])     left face squamous cell, prostate, bladder    Medications:  Prescriptions prior to admission  Medication Sig Dispense Refill Last Dose  . amoxicillin-clavulanate (AUGMENTIN) 875-125 MG tablet Take 1 tablet by mouth 2 (two) times daily. 14 tablet 0 03/29/2015 at Unknown time  . chlorhexidine (PERIDEX) 0.12 % solution Use as directed 15 mLs in the mouth or throat 2 (two) times daily.   03/28/2015 at Unknown time  . dextromethorphan-guaiFENesin (TUSSIN DM) 10-100 MG/5ML liquid Take 5 mLs by mouth every 4 (four) hours as needed for cough.    Past Week at Unknown time  . finasteride (PROSCAR) 5 MG tablet Take 5 mg by mouth daily.   03/29/2015 at Unknown time  . HYDROmorphone (DILAUDID) 4 MG tablet Take 4-8 mg by mouth every 3 (three) hours as needed for severe pain.   Past Week at Unknown time  . hypromellose (SYSTANE OVERNIGHT THERAPY) 0.3 %  GEL ophthalmic ointment Place 1 application into both eyes at bedtime.   03/28/2015 at Unknown time  . methenamine (HIPREX) 1 G tablet Take 1 g by mouth 4 (four) times daily.   03/29/2015 at Unknown time  . nystatin (MYCOSTATIN) 100000 UNIT/ML suspension Take 5 mLs by mouth 2 (two) times daily.   03/28/2015 at Unknown time  . oxymorphone (OPANA ER) 30 MG 12 hr tablet Take 60 mg by mouth 3 (three) times daily.   03/29/2015 at 0645  . pantoprazole (PROTONIX) 40 MG tablet Take 40 mg by mouth daily.   03/29/2015 at Unknown time  . Polyethyl Glycol-Propyl Glycol (SYSTANE OP) Place 1 drop into both eyes as needed (for dry eyes).    03/29/2015 at Unknown time   Assessment: CrCl = 23.9 ml/min Prior use of  augmentin at home, was recently admitted and ordered Zosyn.   Goal of Therapy:  Resolution of infection  Plan:  Expected duration 7 days with resolution of temperature and/or normalization of WBC  Zosyn 3.375 gm IV X 1 given on 11/7 @ 22:00.  Patient initially started on Zosyn 4.5 gm IV Q8H EI on 11/8 @ 6:00.  Due to patient's normal body size (wt= 73.9kg) and borderline renal function, switch to Zosyn 3.375g EIV Q8h for the remainder of therapy.  Pharmacy will continue to monitor for infection resolutsion or changes in renal function (CrCl <20 would qualify patient for q12h dosing).  Vena Rua 03/30/2015,9:01 AM

## 2015-03-30 NOTE — Care Management (Signed)
Spoke with patient daughter [name] at the bedside patient was resting quietly. She stated that usually her father ambulates with a walker and is able to go sit on his porch at home. Patient lives with his spouse. Patient is open to [place].  Daughter stated that patient has not been eating well recently and that PEG tube had been discussed with his MD.Original decision was no but now she is unsure if her father may want this or not. Continue to follow for discharge needs.

## 2015-03-30 NOTE — Consult Note (Signed)
ANTIBIOTIC CONSULT NOTE - INITIAL  Pharmacy Consult for Levofloxacin Indication: pneumonia  No Known Allergies  Patient Measurements: Height: 6' (182.9 cm) Weight: 163 lb (73.936 kg) IBW/kg (Calculated) : 77.6  Vital Signs: Temp: 98.6 F (37 C) (11/08 1416) Temp Source: Oral (11/08 1416) BP: 125/60 mmHg (11/08 1416) Pulse Rate: 71 (11/08 1416) Intake/Output from previous day: 11/07 0701 - 11/08 0700 In: 606.3 [I.V.:606.3] Out: -  Intake/Output from this shift: Total I/O In: 1113.1 [P.O.:360; I.V.:703.1; IV Piggyback:50] Out: -   Labs:  Recent Labs  03/28/15 0333 03/29/15 1727 03/30/15 0538  WBC  --  6.5  --   HGB  --  9.7*  --   PLT  --  244  --   CREATININE 2.31* 2.40* 2.37*   Estimated Creatinine Clearance: 24.3 mL/min (by C-G formula based on Cr of 2.37).  Microbiology: Recent Results (from the past 720 hour(s))  Culture, blood (routine x 2)     Status: None (Preliminary result)   Collection Time: 03/26/15  1:20 PM  Result Value Ref Range Status   Specimen Description BLOOD RIGHT AC  Final   Special Requests BOTTLES DRAWN AEROBIC AND ANAEROBIC  4CC  Final   Culture NO GROWTH 4 DAYS  Final   Report Status PENDING  Incomplete  Urine culture     Status: None   Collection Time: 03/26/15  2:58 PM  Result Value Ref Range Status   Specimen Description URINE, RANDOM  Final   Special Requests NONE  Final   Culture >=100,000 COLONIES/mL PSEUDOMONAS AERUGINOSA  Final   Report Status 03/30/2015 FINAL  Final   Organism ID, Bacteria PSEUDOMONAS AERUGINOSA  Final      Susceptibility   Pseudomonas aeruginosa - MIC*    CEFTAZIDIME Value in next row Intermediate      INTERMEDIATE16    CIPROFLOXACIN Value in next row Sensitive      INTERMEDIATE16    GENTAMICIN Value in next row Intermediate      INTERMEDIATE16    IMIPENEM Value in next row Resistant      RESISTANT>=16    PIP/TAZO Value in next row Sensitive      SENSITIVE<=4    LEVOFLOXACIN Value in next row  Sensitive      SENSITIVE0.5    * >=100,000 COLONIES/mL PSEUDOMONAS AERUGINOSA  Culture, blood (routine x 2)     Status: None (Preliminary result)   Collection Time: 03/26/15  3:25 PM  Result Value Ref Range Status   Specimen Description BLOOD LEFT FOREARM  Final   Special Requests BOTTLES DRAWN AEROBIC AND ANAEROBIC 1CC  Final   Culture  Setup Time   Final    GRAM POSITIVE COCCI ANAEROBIC BOTTLE ONLY CRITICAL RESULT CALLED TO, READ BACK BY AND VERIFIED WITH: [place] LOYE @ 9449 on 03/29/2015 BY CAF CONFIRMED BY TFK AND TB    Culture   Final    COAGULASE NEGATIVE STAPHYLOCOCCUS ANAEROBIC BOTTLE ONLY Results consistent with contamination.    Report Status PENDING  Incomplete    Medical History: Past Medical History  Diagnosis Date  . Coronary artery disease   . Hypertension   . Peripheral vascular disease ([place])   . Chronic kidney disease   . Diabetes mellitus without complication ([place])   . Cancer ([place])     left face squamous cell, prostate, bladder    Medications:  Prescriptions prior to admission  Medication Sig Dispense Refill Last Dose  . amoxicillin-clavulanate (AUGMENTIN) 875-125 MG tablet Take 1 tablet by mouth 2 (two)  times daily. 14 tablet 0 03/29/2015 at Unknown time  . chlorhexidine (PERIDEX) 0.12 % solution Use as directed 15 mLs in the mouth or throat 2 (two) times daily.   03/28/2015 at Unknown time  . dextromethorphan-guaiFENesin (TUSSIN DM) 10-100 MG/5ML liquid Take 5 mLs by mouth every 4 (four) hours as needed for cough.    Past Week at Unknown time  . finasteride (PROSCAR) 5 MG tablet Take 5 mg by mouth daily.   03/29/2015 at Unknown time  . HYDROmorphone (DILAUDID) 4 MG tablet Take 4-8 mg by mouth every 3 (three) hours as needed for severe pain.   Past Week at Unknown time  . hypromellose (SYSTANE OVERNIGHT THERAPY) 0.3 % GEL ophthalmic ointment Place 1 application into both eyes at bedtime.   03/28/2015 at Unknown time  . methenamine (HIPREX) 1 G tablet Take 1 g  by mouth 4 (four) times daily.   03/29/2015 at Unknown time  . nystatin (MYCOSTATIN) 100000 UNIT/ML suspension Take 5 mLs by mouth 2 (two) times daily.   03/28/2015 at Unknown time  . oxymorphone (OPANA ER) 30 MG 12 hr tablet Take 60 mg by mouth 3 (three) times daily.   03/29/2015 at 0645  . pantoprazole (PROTONIX) 40 MG tablet Take 40 mg by mouth daily.   03/29/2015 at Unknown time  . Polyethyl Glycol-Propyl Glycol (SYSTANE OP) Place 1 drop into both eyes as needed (for dry eyes).    03/29/2015 at Unknown time   Scheduled:  . chlorhexidine  15 mL Mouth/Throat BID  . enoxaparin (LOVENOX) injection  30 mg Subcutaneous Q24H  . finasteride  5 mg Oral Daily  . levofloxacin  250 mg Oral Q24H  . levofloxacin  500 mg Oral Once  . morphine  60 mg Oral TID  . nystatin  5 mL Oral BID  . pantoprazole  40 mg Oral QAC breakfast   Infusions:  . sodium chloride 75 mL/hr at 03/29/15 2250   PRN: acetaminophen **OR** acetaminophen, artificial tears, dextromethorphan-guaiFENesin, HYDROmorphone, morphine injection, ondansetron **OR** ondansetron (ZOFRAN) IV, oxyCODONE, polyvinyl alcohol Anti-infectives    Start     Dose/Rate Route Frequency Ordered Stop   03/30/15 1800  levofloxacin (LEVAQUIN) tablet 500 mg     500 mg Oral  Once 03/30/15 1726     03/30/15 1800  levofloxacin (LEVAQUIN) tablet 250 mg     250 mg Oral Every 24 hours 03/30/15 1726     03/30/15 1200  piperacillin-tazobactam (ZOSYN) IVPB 3.375 g  Status:  Discontinued     3.375 g 12.5 mL/hr over 240 Minutes Intravenous 3 times per day 03/30/15 0901 03/30/15 1646   03/30/15 0600  piperacillin-tazobactam (ZOSYN) IVPB 4.5 g  Status:  Discontinued     4.5 g 25 mL/hr over 240 Minutes Intravenous 3 times per day 03/29/15 2226 03/30/15 0901   03/29/15 2045  piperacillin-tazobactam (ZOSYN) IVPB 3.375 g     3.375 g 12.5 mL/hr over 240 Minutes Intravenous  Once 03/29/15 2031 03/30/15 0038     Assessment:  81 yo male ordered levofloxacin for pseudomonas  urinary tract infection  Goal of Therapy:  Resolution of infection.  Plan:  Ordered levofloxacin 500mg  PO once for tonight, followed by 250mg  PO Q24H beginning tomorrow.   [place] Clinical Pharmacist 03/30/2015,5:29 PM

## 2015-03-30 NOTE — Care Management Important Message (Signed)
Important Message  Patient Details  Name: Gregory Luna MRN: [id number] Date of Birth: 03/09/1929   Medicare Important Message Given:  Yes-second notification given    Alvie Heidelberg, RN 03/30/2015, 11:40 AM

## 2015-03-30 NOTE — Progress Notes (Signed)
[facility]  Patient Status: active  [facility] is providing the following services: PT  If patient discharges after hours, please call [phone].   Gregory Luna 03/30/2015, 4:51 PM

## 2015-03-31 ENCOUNTER — Inpatient Hospital Stay: Payer: Medicare Other

## 2015-03-31 LAB — BASIC METABOLIC PANEL
Anion gap: 7 (ref 5–15)
BUN: 39 mg/dL — AB (ref 6–20)
CALCIUM: 8.6 mg/dL — AB (ref 8.9–10.3)
CO2: 21 mmol/L — ABNORMAL LOW (ref 22–32)
CREATININE: 2.31 mg/dL — AB (ref 0.61–1.24)
Chloride: 115 mmol/L — ABNORMAL HIGH (ref 101–111)
GFR calc Af Amer: 28 mL/min — ABNORMAL LOW (ref >60)
GFR, EST NON AFRICAN AMERICAN: 24 mL/min — AB (ref >60)
Glucose, Bld: 95 mg/dL (ref 65–99)
POTASSIUM: 3.7 mmol/L (ref 3.5–5.1)
SODIUM: 143 mmol/L (ref 135–145)

## 2015-03-31 LAB — CBC
HCT: 23.2 % — ABNORMAL LOW (ref 40.0–52.0)
Hemoglobin: 7.8 g/dL — ABNORMAL LOW (ref 13.0–18.0)
MCH: 29.8 pg (ref 26.0–34.0)
MCHC: 33.7 g/dL (ref 32.0–36.0)
MCV: 88.4 fL (ref 80.0–100.0)
PLATELETS: 185 10*3/uL (ref 150–440)
RBC: 2.62 MIL/uL — AB (ref 4.40–5.90)
RDW: 15.3 % — AB (ref 11.5–14.5)
WBC: 5.9 10*3/uL (ref 3.8–10.6)

## 2015-03-31 MED ORDER — DOCUSATE SODIUM 100 MG PO CAPS
100.0000 mg | ORAL_CAPSULE | Freq: Every day | ORAL | Status: DC
  Administered 2015-04-01: 100 mg via ORAL
  Filled 2015-03-31: qty 1

## 2015-03-31 MED ORDER — ENSURE ENLIVE PO LIQD
237.0000 mL | Freq: Three times a day (TID) | ORAL | Status: DC
  Administered 2015-03-31 – 2015-04-01 (×4): 237 mL via ORAL

## 2015-03-31 MED ORDER — SENNA 8.6 MG PO TABS
1.0000 | ORAL_TABLET | Freq: Every day | ORAL | Status: DC
  Administered 2015-04-01: 8.6 mg via ORAL
  Filled 2015-03-31: qty 1

## 2015-03-31 NOTE — NC FL2 (Signed)
[place] LEVEL OF CARE SCREENING TOOL     IDENTIFICATION  Patient Name: Gregory Luna Birthdate: 09-06-1928 Sex: male Admission Date (Current Location): 03/29/2015  [facility] and [place] Number: Engineering geologist and Address:  [facility], [address], [place], [place] [address]      Provider Number: [id number]  Attending Physician Name and Address:  Aldean Jewett, MD  Relative Name and Phone Number:       Current Level of Care: Hospital Recommended Level of Care: [place] Prior Approval Number:    Date Approved/Denied:   PASRR Number:    Discharge Plan: SNF    Current Diagnoses: Patient Active Problem List   Diagnosis Date Noted  . Pseudomonas urinary tract infection 03/29/2015  . Encephalopathy 03/29/2015  . Malnutrition of moderate degree 03/28/2015    Orientation ACTIVITIES/SOCIAL BLADDER RESPIRATION    Self  Active Incontinent Normal  BEHAVIORAL SYMPTOMS/MOOD NEUROLOGICAL BOWEL NUTRITION STATUS   (none)  (none) Incontinent Diet  PHYSICIAN VISITS COMMUNICATION OF NEEDS Height & Weight Skin  30 days Verbally   163 lbs. Normal          AMBULATORY STATUS RESPIRATION    Assist extensive Normal      Personal Care Assistance Level of Assistance  Bathing, Feeding, Dressing Bathing Assistance: Limited assistance Feeding assistance: Limited assistance Dressing Assistance: Limited assistance      Functional Limitations Info  Hearing, Sight Sight Info: Impaired Hearing Info: Impaired         SPECIAL CARE FACTORS FREQUENCY  PT (By licensed PT)                   Additional Factors Info  Code Status, Allergies Code Status Info: Full Allergies Info: nka           Current Medications (03/31/2015): Current Facility-Administered Medications  Medication Dose Route Frequency Provider Last Rate Last Dose  . 0.9 %  sodium chloride infusion   Intravenous Continuous Lytle Butte, MD 75 mL/hr at 03/31/15 0739    . acetaminophen (TYLENOL) tablet 650 mg  650 mg Oral Q6H PRN Lytle Butte, MD       Or  . acetaminophen (TYLENOL) suppository 650 mg  650 mg Rectal Q6H PRN Lytle Butte, MD      . artificial tears ophthalmic ointment   Both Eyes QHS PRN Lytle Butte, MD      . chlorhexidine (PERIDEX) 0.12 % solution 15 mL  15 mL Mouth/Throat BID Lytle Butte, MD   15 mL at 03/30/15 1024  . dextromethorphan-guaiFENesin (ROBITUSSIN-DM) 10-100 MG/5ML liquid 5 mL  5 mL Oral Q4H PRN Lytle Butte, MD      . enoxaparin (LOVENOX) injection 30 mg  30 mg Subcutaneous Q24H Aldean Jewett, MD   30 mg at 03/30/15 2123  . feeding supplement (ENSURE ENLIVE) (ENSURE ENLIVE) liquid 237 mL  237 mL Oral TID Aldean Jewett, MD   237 mL at 03/31/15 1200  . finasteride (PROSCAR) tablet 5 mg  5 mg Oral Daily Lytle Butte, MD   5 mg at 03/31/15 1194  . HYDROmorphone (DILAUDID) tablet 4-8 mg  4-8 mg Oral Q3H PRN Lytle Butte, MD   4 mg at 03/30/15 1441  . levofloxacin (LEVAQUIN) tablet 250 mg  250 mg Oral Q24H Aldean Jewett, MD   250 mg at 03/30/15 1800  . morphine (MS CONTIN) 12 hr tablet 60 mg  60 mg  Oral TID Lytle Butte, MD   60 mg at 03/31/15 1202  . morphine 2 MG/ML injection 2 mg  2 mg Intravenous Q4H PRN Lytle Butte, MD   2 mg at 03/29/15 2250  . nystatin (MYCOSTATIN) 100000 UNIT/ML suspension 500,000 Units  5 mL Oral BID Lytle Butte, MD   500,000 Units at 03/31/15 1029  . ondansetron (ZOFRAN) tablet 4 mg  4 mg Oral Q6H PRN Lytle Butte, MD       Or  . ondansetron [facility]) injection 4 mg  4 mg Intravenous Q6H PRN Lytle Butte, MD      . oxyCODONE (Oxy IR/ROXICODONE) immediate release tablet 5 mg  5 mg Oral Q4H PRN Lytle Butte, MD      . pantoprazole (PROTONIX) EC tablet 40 mg  40 mg Oral QAC breakfast Lytle Butte, MD   40 mg at 03/31/15 0998  . polyvinyl alcohol (LIQUIFILM TEARS) 1.4 % ophthalmic solution 1 drop  1 drop Both Eyes PRN Lytle Butte, MD       Do  not use this list as official medication orders. Please verify with discharge summary.  Discharge Medications:   Medication List    ASK your doctor about these medications        amoxicillin-clavulanate 875-125 MG tablet  Commonly known as:  AUGMENTIN  Take 1 tablet by mouth 2 (two) times daily.     chlorhexidine 0.12 % solution  Commonly known as:  PERIDEX  Use as directed 15 mLs in the mouth or throat 2 (two) times daily.     finasteride 5 MG tablet  Commonly known as:  PROSCAR  Take 5 mg by mouth daily.     HYDROmorphone 4 MG tablet  Commonly known as:  DILAUDID  Take 4-8 mg by mouth every 3 (three) hours as needed for severe pain.     methenamine 1 G tablet  Commonly known as:  HIPREX  Take 1 g by mouth 4 (four) times daily.     nystatin 100000 UNIT/ML suspension  Commonly known as:  MYCOSTATIN  Take 5 mLs by mouth 2 (two) times daily.     oxymorphone 30 MG 12 hr tablet  Commonly known as:  OPANA ER  Take 60 mg by mouth 3 (three) times daily.     pantoprazole 40 MG tablet  Commonly known as:  PROTONIX  Take 40 mg by mouth daily.     SYSTANE OP  Place 1 drop into both eyes as needed (for dry eyes).     SYSTANE OVERNIGHT THERAPY 0.3 % Gel ophthalmic ointment  Generic drug:  hypromellose  Place 1 application into both eyes at bedtime.     TUSSIN DM 10-100 MG/5ML liquid  Generic drug:  dextromethorphan-guaiFENesin  Take 5 mLs by mouth every 4 (four) hours as needed for cough.        Relevant Imaging Results:  Relevant Lab Results:  Recent Labs    Additional Information  (SS: [id number])  Shela Leff, LCSW

## 2015-03-31 NOTE — Progress Notes (Signed)
[facility] [place] NAME: Gregory Luna    MR#:  [id number]  DATE OF BIRTH:  1928/08/11  SUBJECTIVE:  CHIEF COMPLAINT:   Chief Complaint  Patient presents with  . Altered Mental Status   Alert. Eating breakfast. Sitting up. No complaints  REVIEW OF SYSTEMS:   Review of Systems  Constitutional: Negative for fever.  Respiratory: Negative for shortness of breath.   Cardiovascular: Negative for chest pain and palpitations.  Gastrointestinal: Negative for nausea, vomiting and abdominal pain.  Genitourinary: Negative for dysuria.    DRUG ALLERGIES:  No Known Allergies  VITALS:  Blood pressure 116/56, pulse 70, temperature 97.9 F (36.6 C), temperature source Oral, resp. rate 16, height 6' (1.829 m), weight 73.936 kg (163 lb), SpO2 95 %.  PHYSICAL EXAMINATION:  GENERAL: 81 y.o.-year-old patient lying in the bed with no acute distress.  EYES: Right pupil is round and reactive, left eye cloudy, redness of the eyelid eyelid, conjunctival inflammation HEENT: Deep wounds over the left temporal and periauricular area with no surrounding erythema, some beige drainage without odor, there is a protuberance over the left mandible which is slightly tender, mucous membranes are dry, tongue is black tarry, posterior oropharynx is clear NECK: Supple, no jugular venous distention. No thyroid enlargement, no tenderness.  LUNGS: Short shallow respirations, fine diffuse crackles, no wheezes or rhonchi, no respiratory distress CARDIOVASCULAR: S1, S2 normal. No murmurs, rubs, or gallops. Tachycardic ABDOMEN: Soft, nontender, nondistended. Bowel sounds present. No organomegaly or mass. No guarding no rebound EXTREMITIES: +1 pedal edema left ankle, none on the right, no cyanosis, or clubbing.  NEUROLOGIC: Cranial nerves II through XII are grossly intact. Muscle strength 5/5 in all extremities. Sensation intact. Gait not checked.  PSYCHIATRIC:  The patient is alert and oriented to person SKIN: As mentioned above deep skin wounds on the left face, senile purpura over both forearms  LABORATORY PANEL:   CBC  Recent Labs Lab 03/31/15 0535  WBC 5.9  HGB 7.8*  HCT 23.2*  PLT 185   ------------------------------------------------------------------------------------------------------------------  Chemistries   Recent Labs Lab 03/29/15 1727  03/31/15 0535  NA 143  < > 143  K 3.3*  < > 3.7  CL 111  < > 115*  CO2 22  < > 21*  GLUCOSE 153*  < > 95  BUN 41*  < > 39*  CREATININE 2.40*  < > 2.31*  CALCIUM 9.4  < > 8.6*  AST 24  --   --   ALT 35  --   --   ALKPHOS 182*  --   --   BILITOT <0.1*  --   --   < > = values in this interval not displayed. ------------------------------------------------------------------------------------------------------------------  Cardiac Enzymes  Recent Labs Lab 03/29/15 1727  TROPONINI <0.03   ------------------------------------------------------------------------------------------------------------------  RADIOLOGY:  Dg Chest 2 View  03/29/2015  CLINICAL DATA:  Recent UTI, confusion, lethargy EXAM: CHEST  2 VIEW COMPARISON:  03/26/2015 FINDINGS: Lower lung volumes with increased bibasilar atelectasis, worse on the left. Prior coronary bypass. Stable heart size and vascularity. No superimposed edema or effusion. No pneumothorax. Trachea midline. Degenerative changes of the spine. Bones are osteopenic. IMPRESSION: Lower volume exam with increased atelectasis. Electronically Signed   By: Jerilynn Mages.  Shick M.D.   On: 03/29/2015 18:24    EKG:   Orders placed or performed during the hospital encounter of 03/26/15  . EKG 12-Lead  . EKG 12-Lead  . EKG 12-Lead  . EKG 12-Lead  .  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 Management plans discussed with the patient, family and they are in agreement.  CODE STATUS: Full  TOTAL TIME TAKING CARE OF THIS PATIENT: 25 minutes.  Greater than 50% of time spent in care coordination and counseling. POSSIBLE D/C IN 1-2 DAYS, DEPENDING ON CLINICAL CONDITION.   Myrtis Ser M.D on 03/31/2015 at 11:59 AM  Between 7am to 6pm - Pager - [phone]  After 6pm go to [email or web address] - password [email or web address] [facility]  [facility]  Office  [phone]  CC: Primary care physician; Rusty Aus., MD

## 2015-03-31 NOTE — Progress Notes (Signed)
Initial Nutrition Assessment  DOCUMENTATION CODES:   Non-severe (moderate) malnutrition in context of chronic illness  INTERVENTION:  Meals and snacks: Cater to pt preferences, wife requesting liberalize diet and chopped foods.  Medical Nutrition Supplement Therapy: Agree with adding ensure TID for added nutrition  NUTRITION DIAGNOSIS:   Inadequate oral intake related to chronic illness as evidenced by per patient/family report.    GOAL:   Patient will meet greater than or equal to 90% of their needs    MONITOR:    (Energy intake, Electrolyte and renal profile)  REASON FOR ASSESSMENT:   Malnutrition Screening Tool    ASSESSMENT:      Pt admitted with UTI, acute encephalopathy and acute kidney injury  Past Medical History  Diagnosis Date  . Coronary artery disease   . Hypertension   . Peripheral vascular disease ([place])   . Chronic kidney disease   . Diabetes mellitus without complication ([place])   . Cancer (HCC)     left face squamous cell, prostate, bladder    Current Nutrition: not eating per wife because can't chew the foods. Wife reports that she and dtr are considering feeding tube  Food/Nutrition-Related History: Wife reports good appetite at home, eating 3 meals per day and drinking ensure/boost up to 4 times per day   Scheduled Medications:  . chlorhexidine  15 mL Mouth/Throat BID  . enoxaparin (LOVENOX) injection  30 mg Subcutaneous Q24H  . feeding supplement (ENSURE ENLIVE)  237 mL Oral TID  . finasteride  5 mg Oral Daily  . levofloxacin  250 mg Oral Q24H  . morphine  60 mg Oral TID  . nystatin  5 mL Oral BID  . pantoprazole  40 mg Oral QAC breakfast    Continuous Medications:  . sodium chloride 75 mL/hr at 03/31/15 0739     Electrolyte/Renal Profile and Glucose Profile:   Recent Labs Lab 03/29/15 1727 03/30/15 0538 03/31/15 0535  NA 143 142 143  K 3.3* 3.3* 3.7  CL 111 118* 115*  CO2 22 22 21*  BUN 41* 40* 39*  CREATININE 2.40*  2.37* 2.31*  CALCIUM 9.4 8.7* 8.6*  GLUCOSE 153* 114* 95   Protein Profile:   Recent Labs Lab 03/26/15 1320 03/29/15 1727  ALBUMIN 2.6* 2.5*    Gastrointestinal Profile: Last BM:11/7   Nutrition-Focused Physical Exam Findings: Nutrition-Focused physical exam completed. Findings are normal to mild/moderate fat depletion, normal to mild/moderate muscle depletion, and no edema.      Weight Change: noted 7% weight loss in the last 4 months per wt encounters Wife reports weight loss of 20 pounds in the last 3-4  Weeks but still eating 3 meals per day and drinking supplements    Diet Order:  Diet Heart Room service appropriate?: Yes; Fluid consistency:: Thin  Skin:   reviewed   Height:   Ht Readings from Last 1 Encounters:  03/29/15 6' (1.829 m)    Weight:   Wt Readings from Last 1 Encounters:  03/29/15 163 lb (73.936 kg)    Ideal Body Weight:     BMI:  Body mass index is 22.1 kg/(m^2).  Estimated Nutritional Needs:   Kcal:  BEE 1478 kcals (IF 1.1-1.3, AF 1.2) 1884-1660 kcals/d  Protein:  (1.1-1.3 g/kg) 83-98 g/d  Fluid:  (25-20ml/kg) 1875-2255ml/d  EDUCATION NEEDS:   No education needs identified at this time   HIGH Care Level  Suhayla Chisom B. Zenia Resides, [place], [place] (pager)

## 2015-03-31 NOTE — Clinical Documentation Improvement (Signed)
Internal Medicine  Acute kidney injury on chronic kidney kidney disease, baseline creatinine around 2 currently documented.  Can the diagnosis of CKD be further specified?   CKD Stage I - GFR greater than or equal to 90  CKD Stage II - GFR 60-89  CKD Stage III - GFR 30-59  CKD Stage IV - GFR 15-29  Other condition  Unable to clinically determine   Supporting Information: Component BUN Creatinine  Latest Ref Rng 6 - 20 mg/dL 0.61 - 1.24 mg/dL  03/29/2015 41 (H) 2.40 (H)  03/30/2015 40 (H) 2.37 (H)  03/31/2015 39 (H) 2.31 (H)   Component EGFR (Non-African Amer.)  Latest Ref Rng >60 mL/min  03/29/2015 23 (L)  03/30/2015 24 (L)  03/31/2015 24 (L)    Please exercise your independent, professional judgment when responding. A specific answer is not anticipated or expected.  Thank you, Mateo Flow, RN [phone] Clinical Documentation Specialist

## 2015-03-31 NOTE — Clinical Social Work Note (Signed)
Clinical Social Work Assessment  Patient Details  Name: Gregory Luna MRN: [id number] Date of Birth: 08/22/1928  Date of referral:  03/31/15               Reason for consult:  Facility Placement                Permission sought to share information with:  Facility Sport and exercise psychologist, Family Supports Permission granted to share information::  Yes, Verbal Permission Granted  Name::        Agency::     Relationship::     Contact Information:     Housing/Transportation Living arrangements for the past 2 months:  Single Family Home Source of Information:  Adult Children, Spouse Patient Interpreter Needed:  None Criminal Activity/Legal Involvement Pertinent to Current Situation/Hospitalization:  No - Comment as needed Significant Relationships:  Adult Children, Spouse Lives with:  Spouse Do you feel safe going back to the place where you live?  Yes Need for family participation in patient care:  Yes (Comment)  Care giving concerns:  Patient resides with his wife at home.    Social Worker assessment / plan:  CSW spoke with patient, patient's wife [name]) and patient's daughter [name]: [phone]) in patient's hospital room this afternoon. CSW explained the purpose of the CSW visit and discussed the recommendations made by physical therapy for short term rehab. Patient's wife stated that she saw how patient performed today with PT and stated that she was afraid she would not be able to manage patient at home. Patient, patient's daughter, and patient's wife agreed to allow CSW to begin bedsearch in the event that patient did not improve enough to be able to return home at discharge. Facility list provided to patient's wife and daughter. Bedsearch begun today.   Employment status:  Retired Nurse, adult PT Recommendations:  [place] / Referral to community resources:  [place]  Patient/Family's  Response to care:  Patient's daughter and wife are receptive to recommendations. Patient was quiet through assessment but was smiling.   Patient/Family's Understanding of and Emotional Response to Diagnosis, Current Treatment, and Prognosis:  Patient's wife and patient's daughter expressed appreciation for CSW assistance.  Emotional Assessment Appearance:  Appears stated age Attitude/Demeanor/Rapport:   (pleasant and cooperative) Affect (typically observed):  Accepting, Appropriate, Calm Orientation:  Oriented to Self, Oriented to Place Alcohol / Substance use:  Not Applicable Psych involvement (Current and /or in the community):  No (Comment)  Discharge Needs  Concerns to be addressed:  Care Coordination Readmission within the last 30 days:  No Current discharge risk:  None Barriers to Discharge:  No Barriers Identified   Shela Leff, LCSW 03/31/2015, 3:22 PM

## 2015-03-31 NOTE — Evaluation (Signed)
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 Presents today with UTI and AMS, recently discharged for similar complaints.   Clinical Impression  Patient recently discharged from this facility and per previous notes appears to have a stark change in mobility and balance. L side of lips noted to be inferior to R side of lips and patient noted to be leaning heavily to his R during sitting. During previous admission no seated balance deficits were noted. Patient demonstrates poor transfer skills and decreased step lengths, safety, and balance during ambulation with RW during this session as well. Once in chair, patient continues to lean heavily to his R even when bolstered with several pillows. PT discussed concerns in rapid decline in balance and mobility with RN. Unable to test clonus in this session due to poor sitting balance. Patient is able to follow most commands, though very hard of hearing. Skilled PT services are indicated to address his mobility deficits.     Follow Up Recommendations SNF    Equipment Recommendations       Recommendations for Other Services       Precautions / Restrictions Precautions Precautions: Fall Restrictions Weight Bearing Restrictions: No      Mobility  Bed Mobility Overal bed mobility: Needs Assistance Bed Mobility: Supine to Sit     Supine to sit: Mod assist     General bed mobility comments: Patient appears to be confused and requires significant assistance to transfer to EOB. Once at EOB he leans heavily to his R and is unable to sit in midline without max A x1.   Transfers Overall transfer level: Needs assistance Equipment used: Rolling walker (2  wheeled) Transfers: Sit to/from Stand Sit to Stand: Min assist;Min guard         General transfer comment: Patient requires assistance with sit to stand transfer with hand placement, though provides better performance than anticipated, flexed trunk in standing after transfer.   Ambulation/Gait Ambulation/Gait assistance: Min assist;Min guard Ambulation Distance (Feet): 3 Feet Assistive device: Rolling walker (2 wheeled) Gait Pattern/deviations: Antalgic;Decreased step length - right;Decreased step length - left;Narrow base of support;Trunk flexed Gait velocity: decreased   General Gait Details: Patient unable to take steps of any considerable length. Unable to turn or take steps backward without assistance from PT.   Stairs            Wheelchair Mobility    Modified Rankin (Stroke Patients Only)       Balance Overall balance assessment: Needs assistance Sitting-balance support: Bilateral upper extremity supported;Feet supported   Sitting balance - Comments: Patient leans heavily to his R and requires max A x1 and two pillows to maintain midline. Once in midline patient states he feels off balance.  Postural control: Right lateral lean Standing balance support: Bilateral upper extremity supported Standing balance-Leahy Scale: Poor Standing balance comment: Patient demonstrates significantly decreased step length and requires extensive cuing for foot and hand placement.                              Pertinent Vitals/Pain Pain Assessment:  (No mention of pain during this session)    Home Living Family/patient expects to be discharged to:: Private  residence Living Arrangements: Spouse/significant other Available Help at Discharge: Family Type of Home: House Home Access: Stairs to enter   [employer] of Steps: 1 with ramp Home Layout: One level Home Equipment: Bedside commode;Shower seat Additional Comments: 3 wheeled walker    Prior Function  Level of Independence: Needs assistance   Gait / Transfers Assistance Needed: needed supervision with 3 wheeled walker  ADL's / Homemaking Assistance Needed: needed help from wife with all ADLs  Comments: wife assisted with all ADLs including bathing, transfers, helping him to the bedside commode etc.     Hand Dominance        Extremity/Trunk Assessment   Upper Extremity Assessment: Difficult to assess due to impaired cognition           Lower Extremity Assessment: Difficult to assess due to impaired cognition         Communication   Communication: HOH  Cognition Arousal/Alertness: Awake/alert Behavior During Therapy: Flat affect;WFL for tasks assessed/performed Overall Cognitive Status:  (Per the wife patient appears more confused during this session than his baseline. )                      General Comments General comments (skin integrity, edema, etc.): Bandage over L eye and forehead, bruising on the arms.     Exercises        Assessment/Plan    PT Assessment Patient needs continued PT services  PT Diagnosis Difficulty walking;Generalized weakness   PT Problem List Decreased strength;Decreased range of motion;Decreased activity tolerance;Decreased balance;Decreased mobility  PT Treatment Interventions Gait training;Stair training;Functional mobility training;Therapeutic activities;Therapeutic exercise;Balance training;Patient/family education;DME instruction   PT Goals (Current goals can be found in the Care Plan section) Acute Rehab PT Goals Patient Stated Goal: to go home PT Goal Formulation: With patient/family Time For Goal Achievement: 04/04/15 Potential to Achieve Goals: Fair    Frequency Min 2X/week   Barriers to discharge Decreased caregiver support Patient is unable to sit independently and would need mobility assistance for all mobility.     Co-evaluation               End of Session Equipment Utilized During Treatment: Gait  belt Activity Tolerance: Patient tolerated treatment well Patient left: in chair;with call bell/phone within reach;with chair alarm set;with family/visitor present Nurse Communication: Mobility status (Concern regarding sitting balance.)         Time: 1352-1411 PT Time Calculation (min) (ACUTE ONLY): 19 min   Charges:   PT Evaluation $Initial PT Evaluation Tier I: 1 Procedure     PT G Codes:       Kerman Passey, PT, DPT    03/31/2015, 3:24 PM

## 2015-03-31 NOTE — Care Management (Signed)
PT recommended SNF/STR , informed CSW. Continue to follow.

## 2015-04-01 LAB — CBC
HEMATOCRIT: 25.1 % — AB (ref 40.0–52.0)
Hemoglobin: 8.2 g/dL — ABNORMAL LOW (ref 13.0–18.0)
MCH: 28.9 pg (ref 26.0–34.0)
MCHC: 32.7 g/dL (ref 32.0–36.0)
MCV: 88.3 fL (ref 80.0–100.0)
Platelets: 175 10*3/uL (ref 150–440)
RBC: 2.84 MIL/uL — AB (ref 4.40–5.90)
RDW: 15.4 % — AB (ref 11.5–14.5)
WBC: 7.3 10*3/uL (ref 3.8–10.6)

## 2015-04-01 LAB — CULTURE, BLOOD (ROUTINE X 2)

## 2015-04-01 LAB — BASIC METABOLIC PANEL
ANION GAP: 9 (ref 5–15)
BUN: 39 mg/dL — ABNORMAL HIGH (ref 6–20)
CALCIUM: 8.6 mg/dL — AB (ref 8.9–10.3)
CO2: 21 mmol/L — AB (ref 22–32)
Chloride: 110 mmol/L (ref 101–111)
Creatinine, Ser: 2.48 mg/dL — ABNORMAL HIGH (ref 0.61–1.24)
GFR, EST AFRICAN AMERICAN: 26 mL/min — AB (ref >60)
GFR, EST NON AFRICAN AMERICAN: 22 mL/min — AB (ref >60)
Glucose, Bld: 110 mg/dL — ABNORMAL HIGH (ref 65–99)
Potassium: 3.6 mmol/L (ref 3.5–5.1)
Sodium: 140 mmol/L (ref 135–145)

## 2015-04-01 LAB — URINE CULTURE

## 2015-04-01 MED ORDER — DOCUSATE SODIUM 100 MG PO CAPS: 100.0000 mg | ORAL_CAPSULE | Freq: Every day | ORAL | Status: AC

## 2015-04-01 MED ORDER — ENSURE ENLIVE PO LIQD: 237.0000 mL | Freq: Three times a day (TID) | ORAL | Status: AC

## 2015-04-01 MED ORDER — SENNA 8.6 MG PO TABS: 1.0000 | ORAL_TABLET | Freq: Every day | ORAL | Status: AC

## 2015-04-01 MED ORDER — POLYETHYLENE GLYCOL 3350 17 G PO PACK
17.0000 g | PACK | Freq: Two times a day (BID) | ORAL | Status: DC
  Administered 2015-04-01: 17 g via ORAL
  Filled 2015-04-01: qty 1

## 2015-04-01 MED ORDER — POLYETHYLENE GLYCOL 3350 17 G PO PACK: 17.0000 g | PACK | Freq: Two times a day (BID) | ORAL | Status: AC

## 2015-04-01 MED ORDER — OXYMORPHONE HCL ER 30 MG PO TB12: 60.0000 mg | ORAL_TABLET | Freq: Three times a day (TID) | ORAL | Status: AC

## 2015-04-01 MED ORDER — LEVOFLOXACIN 250 MG PO TABS: 250.0000 mg | ORAL_TABLET | Freq: Every day | ORAL | Status: AC

## 2015-04-01 MED ORDER — HYDROMORPHONE HCL 4 MG PO TABS: 4.0000 mg | ORAL_TABLET | ORAL | Status: AC | PRN

## 2015-04-01 NOTE — Clinical Social Work Placement (Signed)
   CLINICAL SOCIAL WORK PLACEMENT  NOTE  Date:  04/01/2015  Patient Details  Name: Gregory Luna MRN: [id number] Date of Birth: 1929-05-05  Clinical Social Work is seeking post-discharge placement for this patient at the [place] level of care (*CSW will initial, date and re-position this form in  chart as items are completed):  Yes   Patient/family provided with [place] Work Department's list of facilities offering this level of care within the geographic area requested by the patient (or if unable, by the patient's family).  Yes   Patient/family informed of their freedom to choose among providers that offer the needed level of care, that participate in Medicare, Medicaid or managed care program needed by the patient, have an available bed and are willing to accept the patient.  Yes   Patient/family informed of [facility]'s ownership interest in [facility] and [facility], as well as of the fact that they are under no obligation to receive care at these facilities.  PASRR submitted to EDS on 03/31/15     PASRR number received on 03/31/15     Existing PASRR number confirmed on       FL2 transmitted to all facilities in geographic area requested by pt/family on 03/31/15     FL2 transmitted to all facilities within larger geographic area on       Patient informed that his/her managed care company has contracts with or will negotiate with certain facilities, including the following:        Yes   Patient/family informed of bed offers received.  Patient chooses bed at  [facility])     Physician recommends and patient chooses bed at  [facility])    Patient to be transferred to  [facility]) on 04/01/15.  Patient to be transferred to facility by  (EMS)     Patient family notified on 04/01/15 of transfer.  Name of family member notified:   (patient's wife)     PHYSICIAN Please sign FL2, Please prepare priority discharge  summary, including medications, Please prepare prescriptions     Additional Comment:    _______________________________________________ Shela Leff, LCSW 04/01/2015, 3:28 PM

## 2015-04-01 NOTE — Clinical Social Work Note (Signed)
MD has decided to discharge patient this afternoon and discharge information sent via EPIC to [facility]. Doug at [facility] is aware. Nurse to call report. Patient to transport via EMS. Shela Leff MSW,LCSW [phone]

## 2015-04-01 NOTE — Progress Notes (Signed)
04/01/2015   BP 104/49 mmHg  Pulse 66  Temp(Src) 97.8 F (36.6 C) (Oral)  Resp 17  Ht 6' (1.829 m)  Wt 73.936 kg (163 lb)  BMI 22.10 kg/m2  SpO2 98%. Alert and oriented. Medicated for chronic pain with scheduled pain medication. Tolerating oral levaquin well. Patient discharged per MD orders. IV removed per policy. Report called to Laney Pastor, Therapist, sports at [facility]. Discharged via Engineer, maintenance by EMS and family.  Almedia Balls, RN

## 2015-04-01 NOTE — Clinical Social Work Note (Signed)
CSW met with patient's daughter this morning and extended bed offers. CSW met with patient's wife this afternoon and she informed CSW that she would choose [facility]. CSW has notified Surveyor, minerals at [facility]. Shela Leff MSW,LCSW [phone]

## 2015-04-01 NOTE — Discharge Summary (Signed)
[facility] [place]   PATIENT NAME: Gregory Luna    MR#:  [id number]  DATE OF BIRTH:  06/05/1928  DATE OF ADMISSION:  03/29/2015 ADMITTING PHYSICIAN: Lytle Butte, MD  DATE OF DISCHARGE: 04/01/2015  PRIMARY CARE PHYSICIAN: Rusty Aus., MD    ADMISSION DIAGNOSIS:  UTI (lower urinary tract infection) [N39.0] Altered mental status, unspecified altered mental status type [R41.82]  DISCHARGE DIAGNOSIS:  Principal Problem:   Pseudomonas urinary tract infection Active Problems:   Encephalopathy   SECONDARY DIAGNOSIS:   Past Medical History  Diagnosis Date  . Coronary artery disease   . Hypertension   . Peripheral vascular disease ([place])   . Chronic kidney disease   . Diabetes mellitus without complication ([place])   . Cancer ([place])     left face squamous cell, prostate, bladder    HOSPITAL COURSE:    #1 acute encephalopathy: Resolved - Due to metabolic encephalopathy in the setting of urinary tract infection - Family reports that the patient is back to baseline. He is communicative and alert. He denies pain. He makes jokes and seems very oriented.  #2 Pseudomonas urinary tract infection: On his prior admission he was discharged on Augmentin.. When culture results and sensitivities returned the physician called the family but he had already returned to the emergency room. He is growing Pseudomonas sensitive to ciprofloxacin. He has been on Levaquin during this hospitalization and I will continue to complete a 10 day course. The family is concerned about how to ensure the infection has been cleared. Certainly a repeat UA after treatment course is completed would be reasonable. Note that he does have active bladder cancer and has had recurrent urinary tract infections. He is followed by infectious disease. I will make a follow-up appointment for him in infectious disease clinic.  #3 chronic kidney disease stage IV:  Fairly stable. Encourage by mouth intake to avoid dehydration. Avoid nephrotoxins.  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  #6 deconditioning: He has been delayed by physical therapy and skilled nursing has been recommended for rehabilitation. I agree with this decision as he lives alone with his wife who would not be able to support him in the event of a fall and would struggle to help him with his ADLs at this time.  DISCHARGE CONDITIONS:   Fair  CONSULTS OBTAINED:  Treatment Team:  Lytle Butte, MD  DRUG ALLERGIES:  No Known Allergies  DISCHARGE MEDICATIONS:   Current Discharge Medication List    START taking these medications   Details  docusate sodium (COLACE) 100 MG capsule Take 1 capsule (100 mg total) by mouth daily. Qty: 10 capsule, Refills: 0    feeding supplement, ENSURE ENLIVE, (ENSURE ENLIVE) LIQD Take 237 mLs by mouth 3 (three) times daily. Qty: 237 mL, Refills: 12    levofloxacin (LEVAQUIN) 250 MG tablet Take 1 tablet (250 mg total) by mouth daily. Qty: 10 tablet, Refills: 0    polyethylene glycol (MIRALAX / GLYCOLAX) packet Take 17 g by mouth 2 (two) times daily. Qty: 14 each, Refills: 0    senna (SENOKOT) 8.6 MG TABS tablet Take 1 tablet (8.6 mg total) by mouth daily. Qty: 120 each, Refills: 0      CONTINUE these medications which have CHANGED   Details  HYDROmorphone (DILAUDID) 4 MG tablet Take 1-2 tablets (4-8 mg total) by mouth every 3 (three)  hours as needed for severe pain. Qty: 30 tablet, Refills: 0      CONTINUE these medications which have NOT CHANGED   Details  chlorhexidine (PERIDEX) 0.12 % solution Use as directed 15 mLs in the mouth or throat 2 (two) times daily.    dextromethorphan-guaiFENesin (TUSSIN DM) 10-100 MG/5ML liquid Take 5 mLs by mouth every 4 (four) hours as needed for cough.     finasteride (PROSCAR) 5 MG tablet Take 5 mg by mouth  daily.    hypromellose (SYSTANE OVERNIGHT THERAPY) 0.3 % GEL ophthalmic ointment Place 1 application into both eyes at bedtime.    methenamine (HIPREX) 1 G tablet Take 1 g by mouth 4 (four) times daily.    nystatin (MYCOSTATIN) 100000 UNIT/ML suspension Take 5 mLs by mouth 2 (two) times daily.    oxymorphone (OPANA ER) 30 MG 12 hr tablet Take 60 mg by mouth 3 (three) times daily.    pantoprazole (PROTONIX) 40 MG tablet Take 40 mg by mouth daily.    Polyethyl Glycol-Propyl Glycol (SYSTANE OP) Place 1 drop into both eyes as needed (for dry eyes).       STOP taking these medications     amoxicillin-clavulanate (AUGMENTIN) 875-125 MG tablet          DISCHARGE INSTRUCTIONS:    DIET:  Regular diet, dysphagia 2, soft foods, thin liquids  DISCHARGE CONDITION:  Fair  ACTIVITY:  Activity as tolerated, per physical therapy recommendations  OXYGEN:  Home Oxygen: No.   Oxygen Delivery: room air  DISCHARGE LOCATION:  nursing home for acute rehabilitation  If you experience worsening of your admission symptoms, develop shortness of breath, life threatening emergency, suicidal or homicidal thoughts you must seek medical attention immediately by calling 911 or calling your MD immediately  if symptoms less severe.  You Must read complete instructions/literature along with all the possible adverse reactions/side effects for all the Medicines you take and that have been prescribed to you. Take any new Medicines after you have completely understood and accpet all the possible adverse reactions/side effects.   Please note  You were cared for by a hospitalist during your hospital stay. If you have any questions about your discharge medications or the care you received while you were in the hospital after you are discharged, you can call the unit and asked to speak with the hospitalist on call if the hospitalist that took care of you is not available. Once you are discharged, your primary  care physician will handle any further medical issues. Please note that NO REFILLS for any discharge medications will be authorized once you are discharged, as it is imperative that you return to your primary care physician (or establish a relationship with a primary care physician if you do not have one) for your aftercare needs so that they can reassess your need for medications and monitor your lab values.    Today   CHIEF COMPLAINT:   Chief Complaint  Patient presents with  . Altered Mental Status    HISTORY OF PRESENT ILLNESS:  Conlin Lebeau is a 81 y.o. male with a known history of squamous cell carcinoma of the face, chronic kidney disease baseline creatinine around 2 who is presenting with altered mental status. The patient is unable to provide meaningful information given mental status/medical condition history obtained from family hours present at bedside. Of note [facility] with discharge diagnosis UTI placed on amoxicillin for antibiotic coverage after being transitioned from IV Zosyn.  The patient had improvement before and upon discharge however now one day duration of altered mental status described mainly as lethargy/somnolent. No further symptomatology  VITAL SIGNS:  Blood pressure 104/49, pulse 66, temperature 97.8 F (36.6 C), temperature source Oral, resp. rate 17, height 6' (1.829 m), weight 73.936 kg (163 lb), SpO2 98 %.  I/O:   Intake/Output Summary (Last 24 hours) at 04/01/15 1503 Last data filed at 04/01/15 0900  Gross per 24 hour  Intake   1261 ml  Output      0 ml  Net   1261 ml    PHYSICAL EXAMINATION:  GENERAL: 81 y.o.-year-old patient lying in the bed with no acute distress.  EYES: Right pupil is round and reactive, left eye cloudy, redness of the eyelid eyelid, conjunctival inflammation HEENT: Deep wounds over the left temporal and periauricular area with no surrounding erythema, some beige drainage  without odor, there is a protuberance over the left mandible which is slightly tender, mucous membranes are dry, tongue is black tarry, posterior oropharynx is clear NECK: Supple, no jugular venous distention. No thyroid enlargement, no tenderness.  LUNGS: Short shallow respirations, fine diffuse crackles, no wheezes or rhonchi, no respiratory distress CARDIOVASCULAR: S1, S2 normal. No murmurs, rubs, or gallops. Tachycardic ABDOMEN: Soft, nontender, nondistended. Bowel sounds present. No organomegaly or mass. No guarding no rebound EXTREMITIES: +1 pedal edema left ankle, none on the right, no cyanosis, or clubbing.  NEUROLOGIC: Pronounced left-sided facial droop, Muscle strength globally decreased in all extremities. Sensation intact. Gait not checked.  PSYCHIATRIC: The patient is alert and oriented to person SKIN: As mentioned above deep skin wounds on the left face, senile purpura over both forearms  DATA REVIEW:   CBC  Recent Labs Lab 04/01/15 0618  WBC 7.3  HGB 8.2*  HCT 25.1*  PLT 175    Chemistries   Recent Labs Lab 03/29/15 1727  04/01/15 0618  NA 143  < > 140  K 3.3*  < > 3.6  CL 111  < > 110  CO2 22  < > 21*  GLUCOSE 153*  < > 110*  BUN 41*  < > 39*  CREATININE 2.40*  < > 2.48*  CALCIUM 9.4  < > 8.6*  AST 24  --   --   ALT 35  --   --   ALKPHOS 182*  --   --   BILITOT <0.1*  --   --   < > = values in this interval not displayed.  Cardiac Enzymes  Recent Labs Lab 03/29/15 1727  TROPONINI <0.03    Microbiology Results  Results for orders placed or performed during the hospital encounter of 03/29/15  Urine culture     Status: None   Collection Time: 03/29/15  6:40 PM  Result Value Ref Range Status   Specimen Description URINE, RANDOM  Final   Special Requests NONE  Final   Culture 10,000 COLONIES/mL PSEUDOMONAS AERUGINOSA  Final   Report Status 04/01/2015 FINAL  Final   Organism ID, Bacteria PSEUDOMONAS AERUGINOSA  Final      Susceptibility    Pseudomonas aeruginosa - MIC*    CEFTAZIDIME 4 SENSITIVE Sensitive     CIPROFLOXACIN <=0.25 SENSITIVE Sensitive     GENTAMICIN 4 INTERMEDIATE Intermediate     IMIPENEM 8 INTERMEDIATE Intermediate     PIP/TAZO Value in next row Sensitive      SENSITIVE<=4    * 10,000 COLONIES/mL PSEUDOMONAS AERUGINOSA    RADIOLOGY:  Ct Head Wo Contrast  03/31/2015  CLINICAL DATA:  Altered mental status. Squamous cell carcinoma of face. EXAM: CT HEAD WITHOUT CONTRAST TECHNIQUE: Contiguous axial images were obtained from the base of the skull through the vertex without intravenous contrast. COMPARISON:  November 29, 2014 FINDINGS: There is a mass arising in the subcutaneous soft tissues on the left just anterior to the left zygomatic arch measuring 2.3 x 1.2 cm. There is a mass arising in the region of the the left parotid gland, at the level of the left mandibular condyles measuring 3.5 x 2.9 cm. There appears to be diffuse infiltration of tumor in the muscles between the parotid gland and the left pterygoid plate. There is marked thickening in this area compared to the normal-appearing right side with loss of fat planes. This area is felt to represent extensive neoplastic involvement inferior to the skullbase. No bony destruction is seen in this area. There is moderate diffuse atrophy. There is no intracranial mass, hemorrhage, extra-axial fluid collection, or midline shift. There is patchy small vessel disease in the centra semiovale bilaterally. No acute infarct evident. Bony calvarium appears intact. The mastoid air cells are clear. The patient has undergone sclerotic banding in each globe. IMPRESSION: Evidence of mass in the soft tissues of the left upper face at the level of the left zygomatic arch. Extensive tumor extends into the left parotid gland and deep into the muscles between the left parotid gland and pterygoid plate on the left. There is no bony destruction in these areas. There is moderate atrophy with patchy  periventricular small vessel disease. No intracranial mass, hemorrhage, or acute appearing infarct. Advise MRI of the brain and skull base regions to assess the extent of tumor on the left. These results will be called to the ordering clinician or representative by the Radiologist Assistant, and communication documented in the PACS or zVision Dashboard. Electronically Signed   By: Lowella Grip III M.D.   On: 03/31/2015 16:07    EKG:   Orders placed or performed during the hospital encounter of 03/26/15  . EKG 12-Lead  . EKG 12-Lead  . EKG 12-Lead  . EKG 12-Lead  . EKG      Management plans discussed with the patient, family and they are in agreement.  CODE STATUS:     Code Status Orders        Start     Ordered   03/29/15 2038  Full code   Continuous     03/29/15 2039      TOTAL TIME TAKING CARE OF THIS PATIENT: 35 minutes.  Greater than 50% of time spent in care coordination and counseling.  Myrtis Ser M.D on 04/01/2015 at 3:03 PM  Between 7am to 6pm - Pager - [phone]  After 6pm go to [email or web address] - password [email or web address] [facility]  [place] [facility]  Office  [phone]  CC: Primary care physician; Rusty Aus., MD

## 2015-04-01 NOTE — Care Management Important Message (Signed)
Important Message  Patient Details  Name: Gregory Luna MRN: [id number] Date of Birth: 06/01/1928   Medicare Important Message Given:  Yes    Alvie Heidelberg, RN 04/01/2015, 2:39 PM

## 2015-04-05 LAB — CULTURE, BLOOD (ROUTINE X 2): Culture: NO GROWTH

## 2015-04-16 NOTE — Progress Notes (Signed)
Rx for levaquin 250 mg daily for 7 days called in to pharamcy [phone]. D.w patient's family. Patient treated for 10 days for 10,000 pseudomonas blood/urine cx from 11/4. Patient would need at least 14 days of treatment.

## 2015-06-23 DEATH — deceased

## 2015-11-10 IMAGING — CR DG CHEST 2V
2 series · 2 of 2 positions shown · non-contrast
Comparison: 03/26/2015

CLINICAL DATA: Recent UTI, confusion, lethargy

EXAM:
CHEST  2 VIEW

[chest lat]
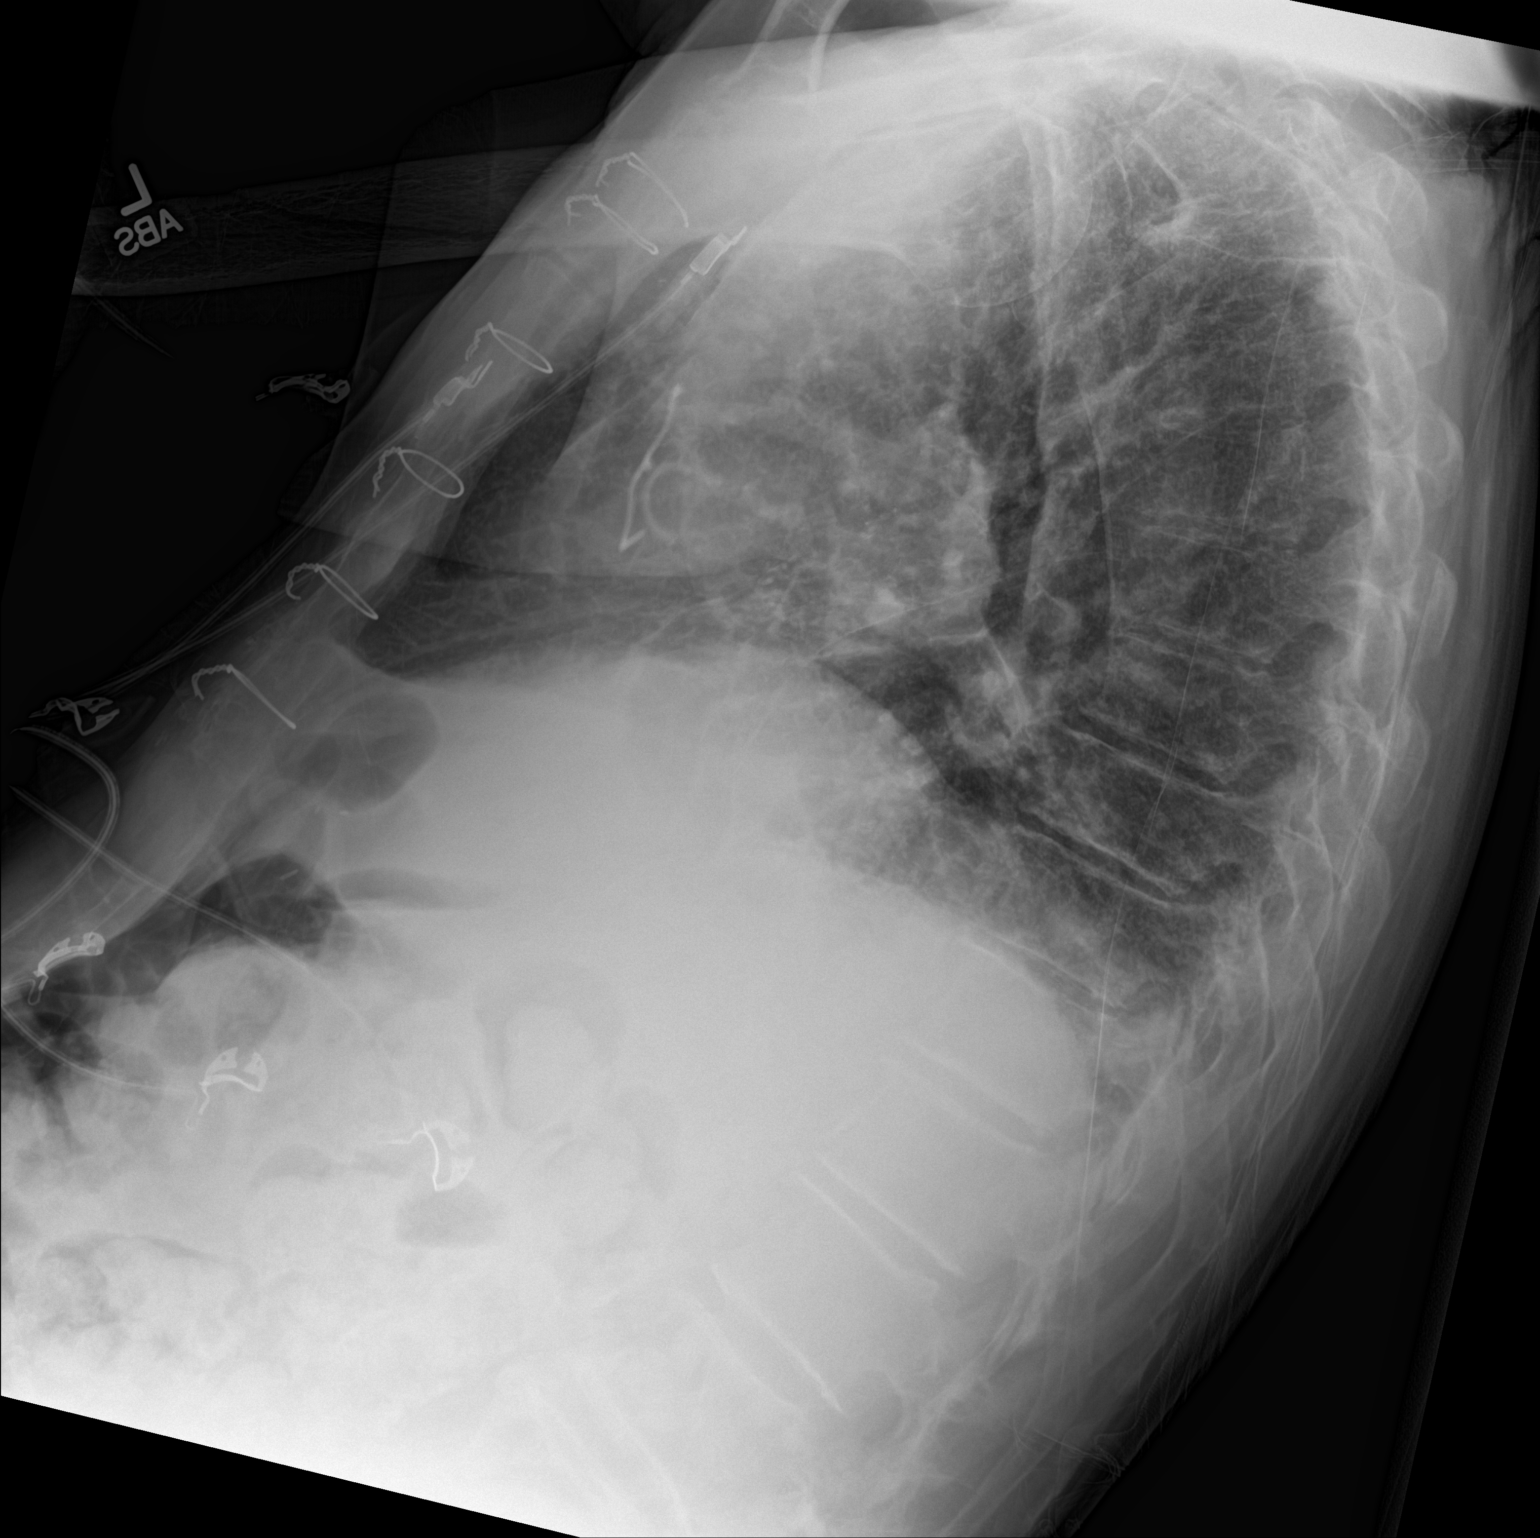

[chest ap]
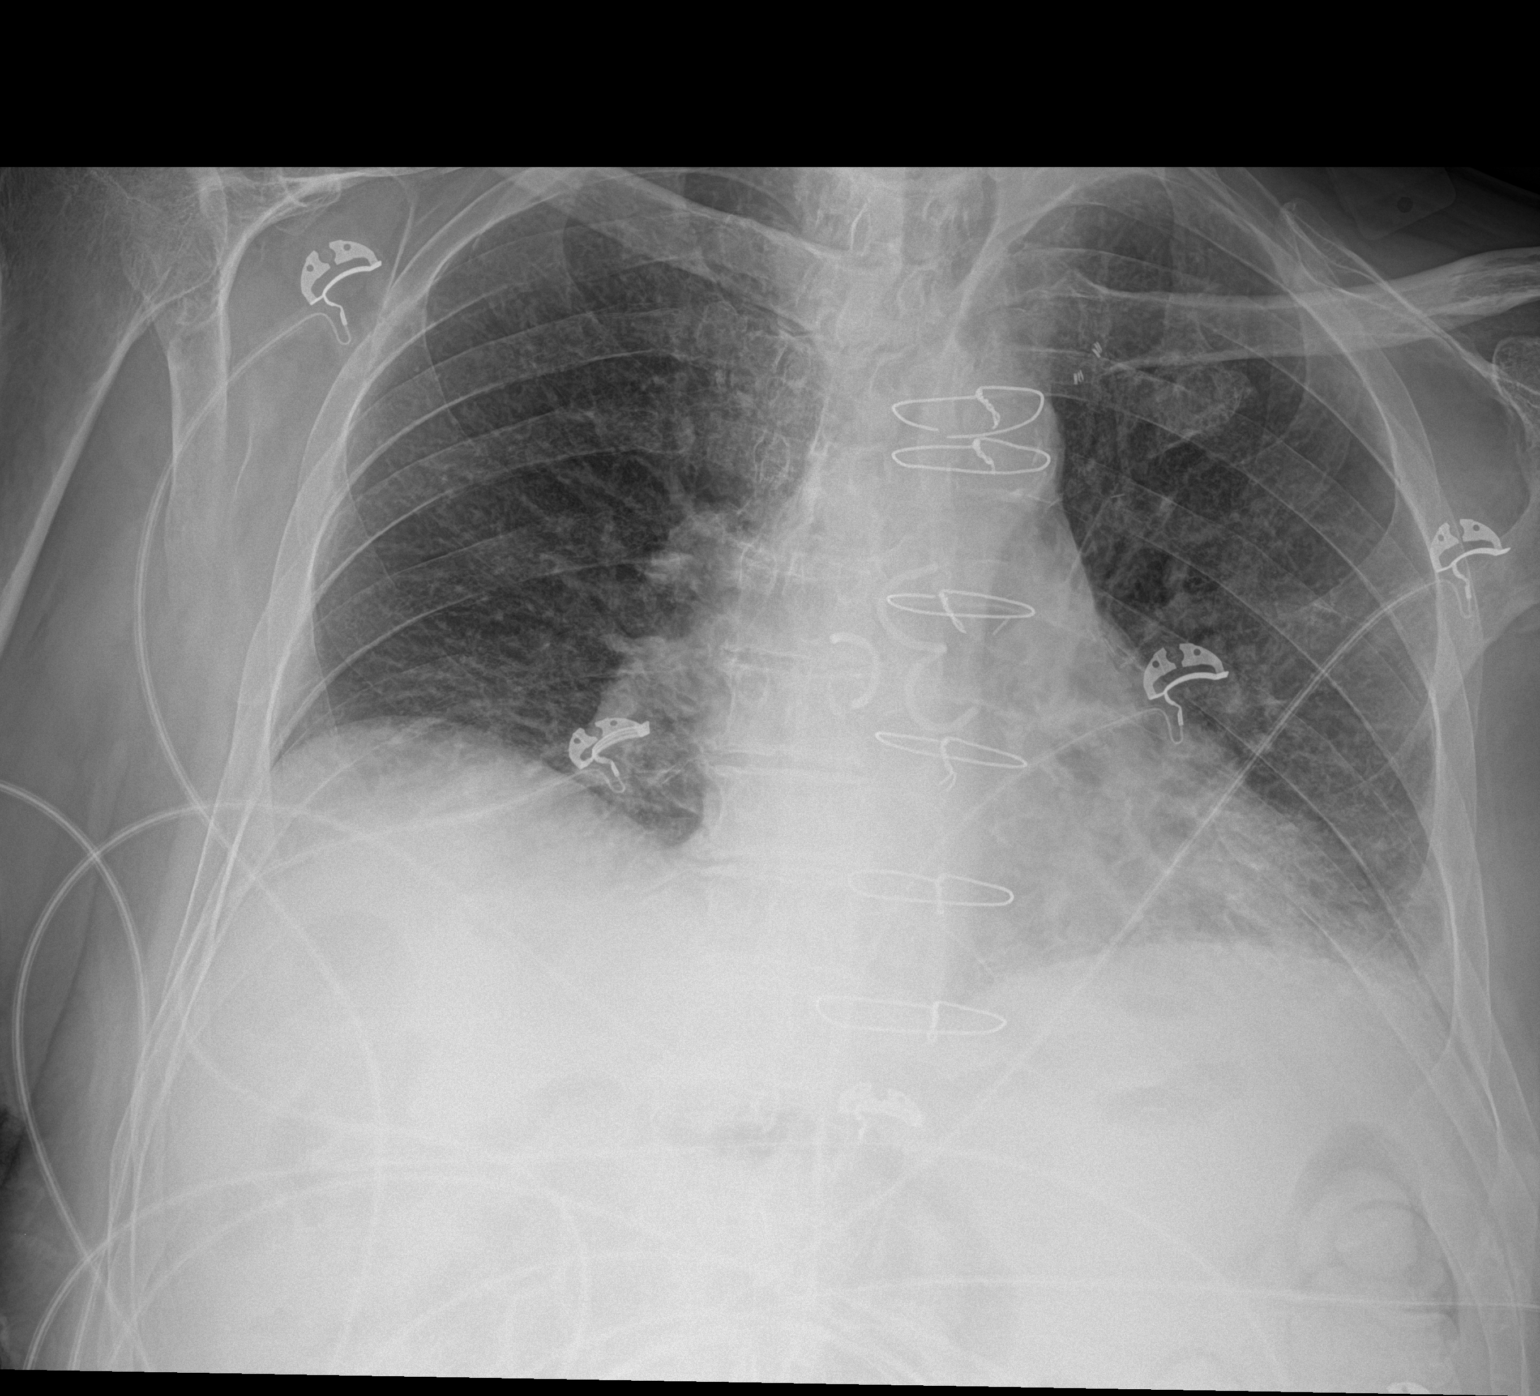

[2 of 2 positions shown; findings below may reference images not displayed]

FINDINGS: Lower lung volumes with increased bibasilar atelectasis, worse on
the left. Prior coronary bypass. Stable heart size and vascularity.
No superimposed edema or effusion. No pneumothorax. Trachea midline.
Degenerative changes of the spine. Bones are osteopenic.
IMPRESSION: Lower volume exam with increased atelectasis.

## 2017-10-29 IMAGING — US US RENAL
1 series · 14 of 25 positions shown · non-contrast
Comparison: 04/14/2014

CLINICAL DATA: Acute renal failure

EXAM:
RENAL / URINARY TRACT ULTRASOUND COMPLETE

[Series 1: us renal · 14 of 25 slices shown]
[im 1/25]
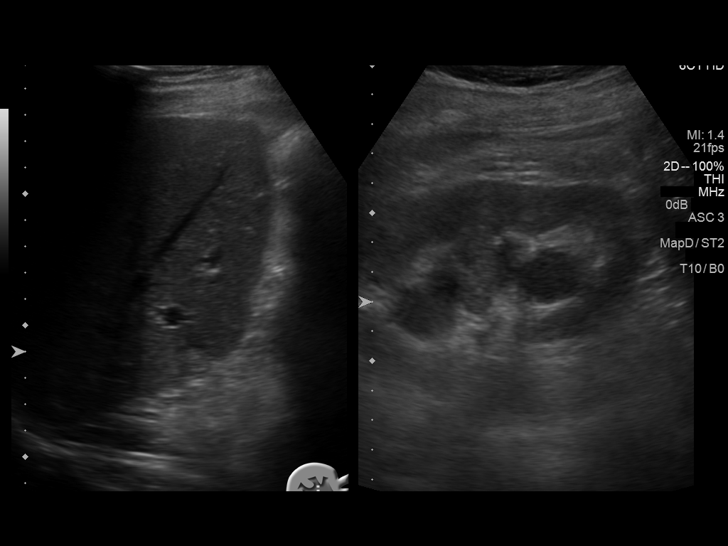
[im 3/25]
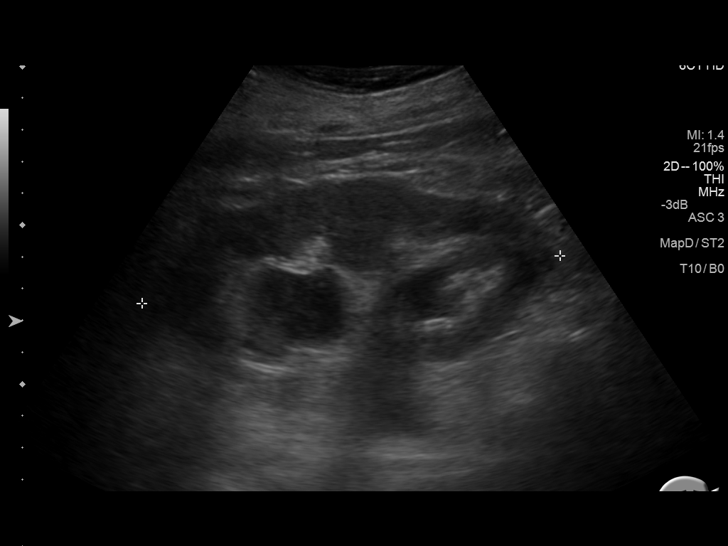
[im 5/25]
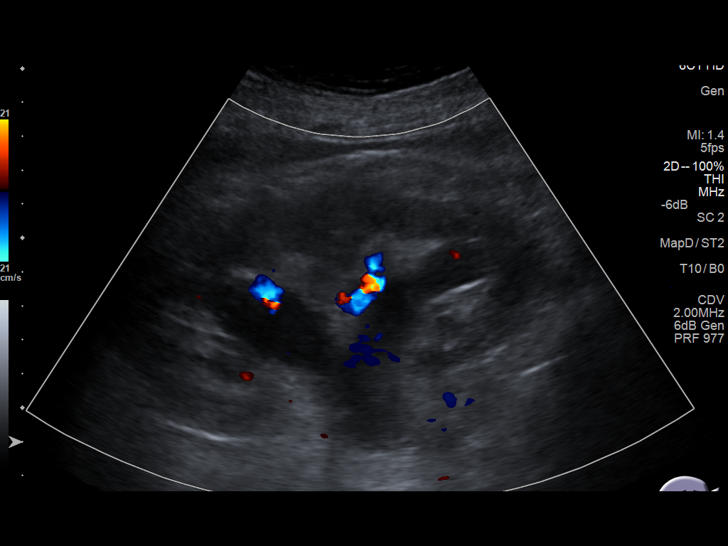
[im 7/25]
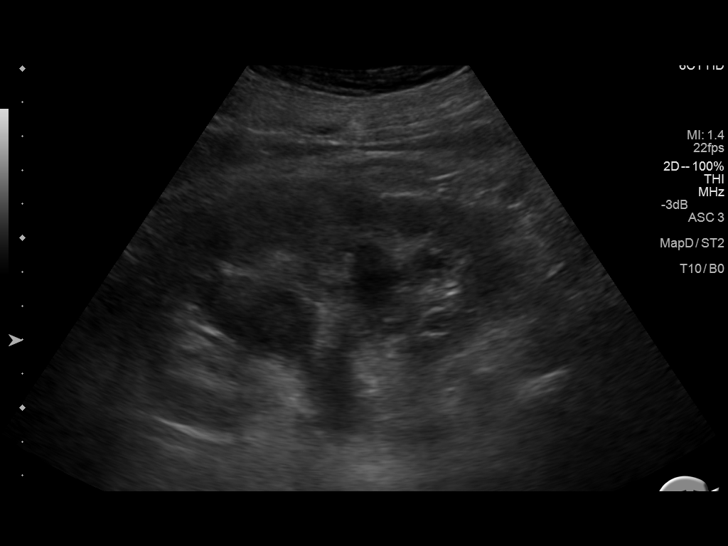
[im 9/25]
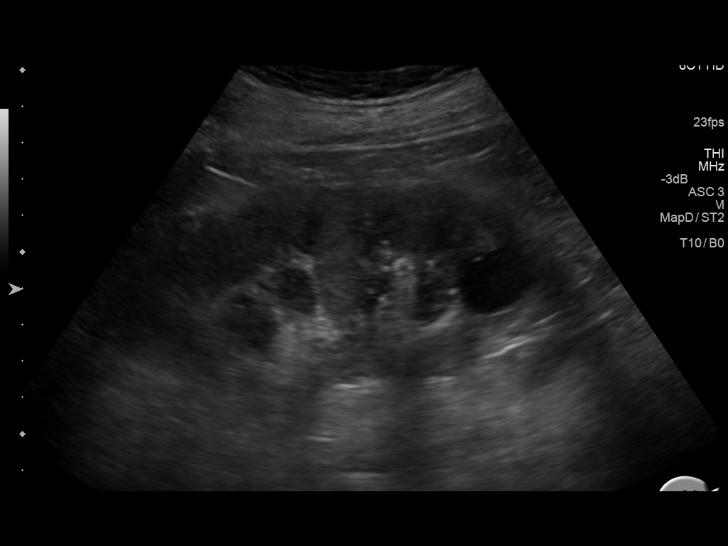
[im 10/25]
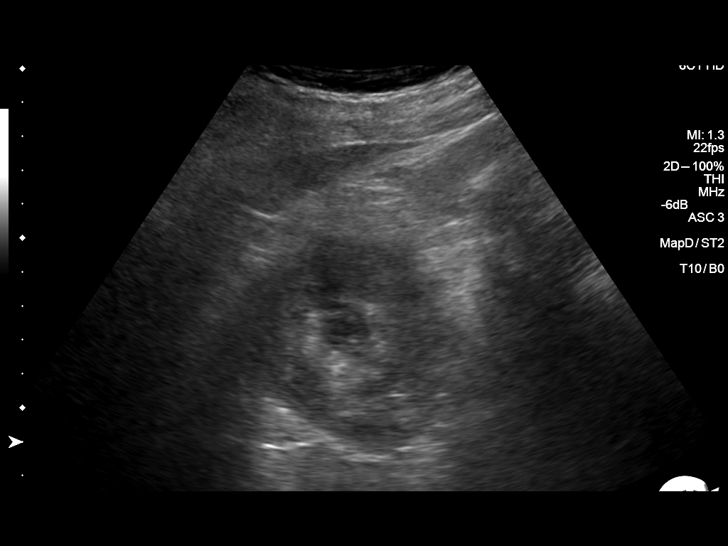
[im 12/25]
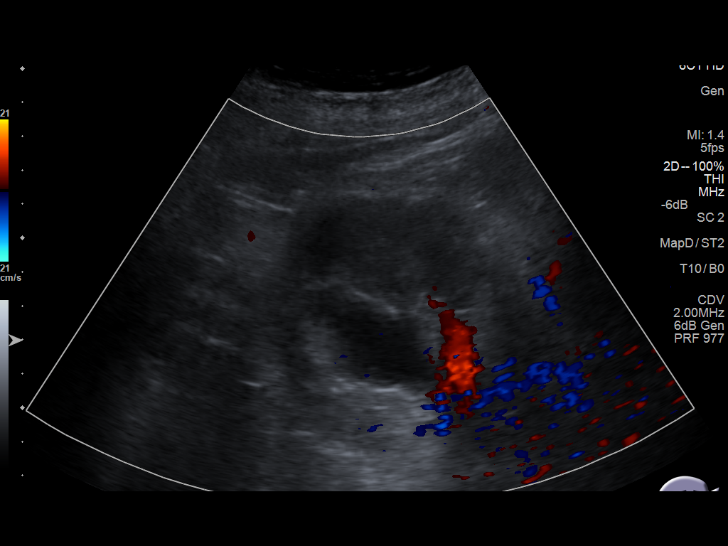
[im 14/25]
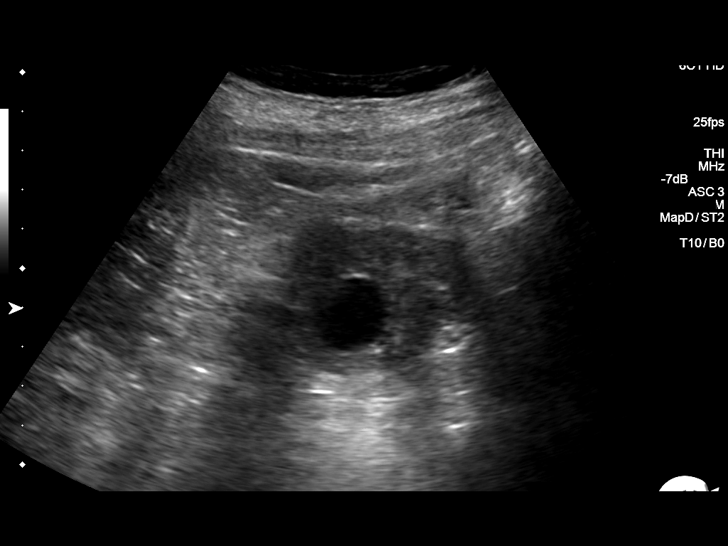
[im 16/25]
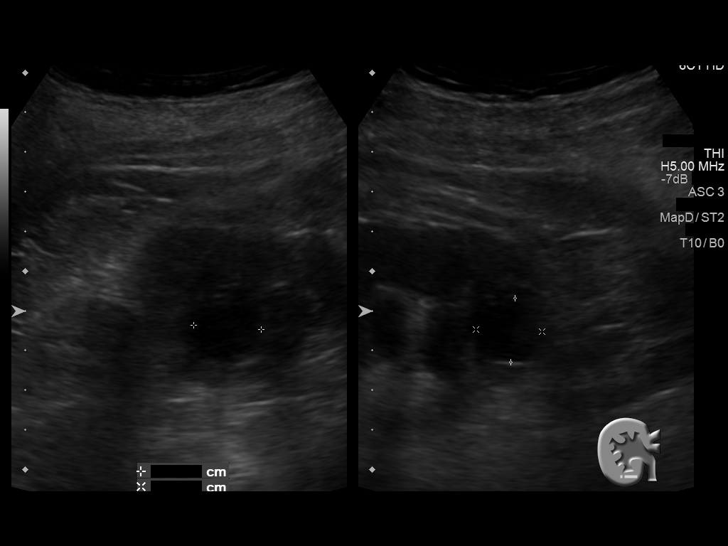
[im 17/25]
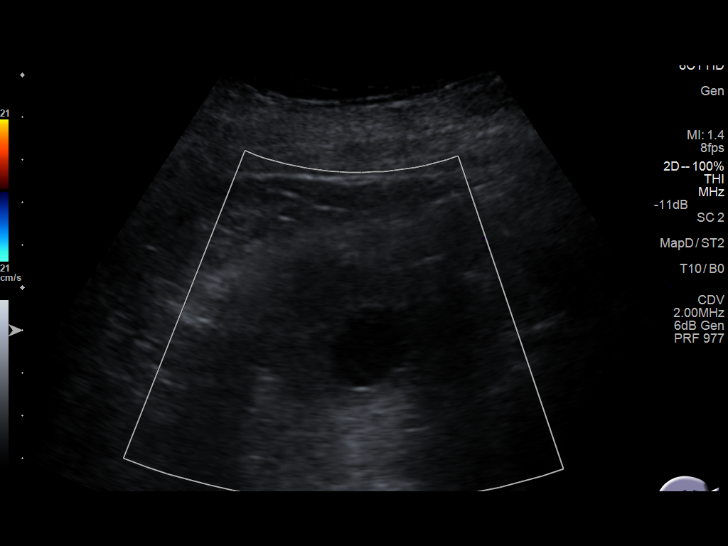
[im 19/25]
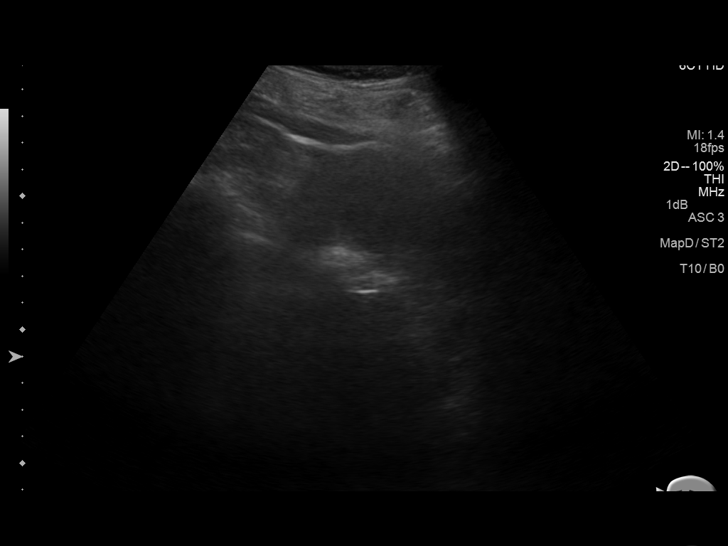
[im 21/25]
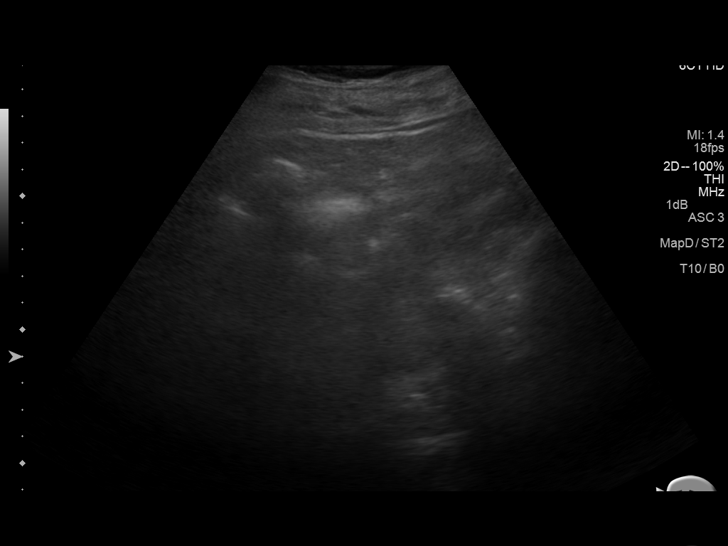
[im 23/25]
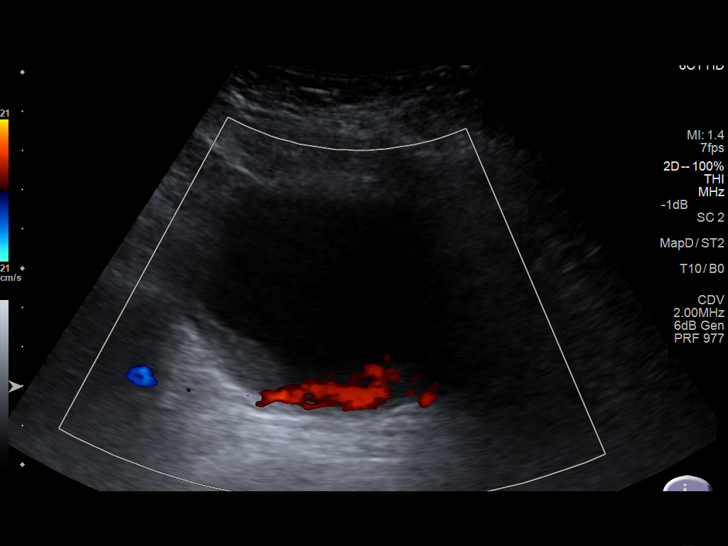
[im 25/25]
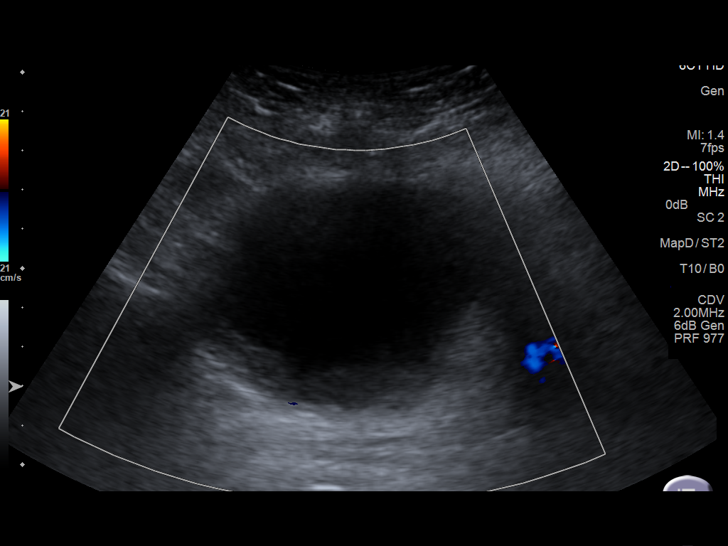

[14 of 25 positions shown; findings below may reference images not displayed]

FINDINGS: Right Kidney:

Length: 13.2 cm.. Moderate hydronephrosis. There is a lower pole
cyst which measures 1.7 x 1.6 x 1.7 cm.

Left Kidney:

No left kidney.

Bladder:

Debris identified within the urinary bladder.
IMPRESSION: 1. Hydronephrotic solitary right kidney.
